# Patient Record
Sex: Male | Born: 1935 | Race: White | Hispanic: No | Smoking: Former smoker
Health system: Southern US, Community
[De-identification: ages and names within clinical notes are randomized; demographics above are authoritative.]

## PROBLEM LIST (undated history)

## (undated) DIAGNOSIS — H25019 Cortical age-related cataract, unspecified eye: Secondary | ICD-10-CM

## (undated) DIAGNOSIS — C679 Malignant neoplasm of bladder, unspecified: Secondary | ICD-10-CM

## (undated) DIAGNOSIS — K922 Gastrointestinal hemorrhage, unspecified: Secondary | ICD-10-CM

## (undated) DIAGNOSIS — Z889 Allergy status to unspecified drugs, medicaments and biological substances status: Secondary | ICD-10-CM

## (undated) DIAGNOSIS — I499 Cardiac arrhythmia, unspecified: Secondary | ICD-10-CM

## (undated) DIAGNOSIS — M1990 Unspecified osteoarthritis, unspecified site: Secondary | ICD-10-CM

## (undated) DIAGNOSIS — K7030 Alcoholic cirrhosis of liver without ascites: Secondary | ICD-10-CM

## (undated) DIAGNOSIS — Z86718 Personal history of other venous thrombosis and embolism: Secondary | ICD-10-CM

## (undated) DIAGNOSIS — I10 Essential (primary) hypertension: Secondary | ICD-10-CM

## (undated) DIAGNOSIS — I493 Ventricular premature depolarization: Secondary | ICD-10-CM

## (undated) DIAGNOSIS — F1020 Alcohol dependence, uncomplicated: Secondary | ICD-10-CM

## (undated) DIAGNOSIS — I82409 Acute embolism and thrombosis of unspecified deep veins of unspecified lower extremity: Secondary | ICD-10-CM

## (undated) DIAGNOSIS — E7800 Pure hypercholesterolemia, unspecified: Secondary | ICD-10-CM

## (undated) DIAGNOSIS — I4891 Unspecified atrial fibrillation: Secondary | ICD-10-CM

## (undated) DIAGNOSIS — Z86711 Personal history of pulmonary embolism: Secondary | ICD-10-CM

## (undated) DIAGNOSIS — G709 Myoneural disorder, unspecified: Secondary | ICD-10-CM

## (undated) HISTORY — PX: ABDOMINAL SURGERY: SHX537

## (undated) HISTORY — PX: TOTAL KNEE ARTHROPLASTY: SHX125

## (undated) HISTORY — PX: BLADDER REMOVAL: SHX567

## (undated) HISTORY — PX: TONSILLECTOMY: SUR1361

## (undated) HISTORY — PX: MANDIBLE FRACTURE SURGERY: SHX706

## (undated) HISTORY — PX: APPENDECTOMY: SHX54

## (undated) HISTORY — PX: JOINT REPLACEMENT: SHX530

## (undated) HISTORY — PX: NASAL SEPTUM SURGERY: SHX37

## (undated) HISTORY — PX: HERNIA REPAIR: SHX51

## (undated) HISTORY — PX: CHOLECYSTECTOMY: SHX55

---

## 2002-06-28 ENCOUNTER — Encounter: Admission: RE | Admit: 2002-06-28 | Discharge: 2002-06-28 | Payer: Self-pay | Admitting: Neurology

## 2002-06-28 ENCOUNTER — Encounter: Payer: Self-pay | Admitting: Neurology

## 2017-02-24 DIAGNOSIS — F39 Unspecified mood [affective] disorder: Secondary | ICD-10-CM | POA: Diagnosis not present

## 2017-02-24 DIAGNOSIS — K745 Biliary cirrhosis, unspecified: Secondary | ICD-10-CM

## 2017-02-24 DIAGNOSIS — D090 Carcinoma in situ of bladder: Secondary | ICD-10-CM | POA: Diagnosis not present

## 2017-02-24 DIAGNOSIS — Z936 Other artificial openings of urinary tract status: Secondary | ICD-10-CM

## 2017-02-24 DIAGNOSIS — I480 Paroxysmal atrial fibrillation: Secondary | ICD-10-CM

## 2017-02-28 DIAGNOSIS — L03311 Cellulitis of abdominal wall: Secondary | ICD-10-CM | POA: Diagnosis not present

## 2017-03-23 ENCOUNTER — Other Ambulatory Visit: Payer: Self-pay

## 2017-03-23 ENCOUNTER — Encounter: Payer: Self-pay | Admitting: Emergency Medicine

## 2017-03-23 ENCOUNTER — Emergency Department: Payer: Medicare Other

## 2017-03-23 ENCOUNTER — Observation Stay
Admission: EM | Admit: 2017-03-23 | Discharge: 2017-03-24 | Disposition: A | Discharge status: Home / Self Care | Payer: Medicare Other | Attending: Internal Medicine | Admitting: Internal Medicine

## 2017-03-23 DIAGNOSIS — Z7901 Long term (current) use of anticoagulants: Secondary | ICD-10-CM | POA: Diagnosis not present

## 2017-03-23 DIAGNOSIS — E860 Dehydration: Secondary | ICD-10-CM | POA: Insufficient documentation

## 2017-03-23 DIAGNOSIS — E785 Hyperlipidemia, unspecified: Secondary | ICD-10-CM | POA: Diagnosis not present

## 2017-03-23 DIAGNOSIS — I482 Chronic atrial fibrillation: Secondary | ICD-10-CM | POA: Insufficient documentation

## 2017-03-23 DIAGNOSIS — Z79899 Other long term (current) drug therapy: Secondary | ICD-10-CM | POA: Diagnosis not present

## 2017-03-23 DIAGNOSIS — N179 Acute kidney failure, unspecified: Secondary | ICD-10-CM | POA: Diagnosis not present

## 2017-03-23 DIAGNOSIS — N189 Chronic kidney disease, unspecified: Principal | ICD-10-CM | POA: Diagnosis present

## 2017-03-23 DIAGNOSIS — R2681 Unsteadiness on feet: Secondary | ICD-10-CM | POA: Diagnosis not present

## 2017-03-23 DIAGNOSIS — I509 Heart failure, unspecified: Secondary | ICD-10-CM | POA: Insufficient documentation

## 2017-03-23 DIAGNOSIS — Z8551 Personal history of malignant neoplasm of bladder: Secondary | ICD-10-CM | POA: Diagnosis not present

## 2017-03-23 HISTORY — DX: Malignant neoplasm of bladder, unspecified: C67.9

## 2017-03-23 HISTORY — DX: Unspecified atrial fibrillation: I48.91

## 2017-03-23 LAB — BASIC METABOLIC PANEL
ANION GAP: 13 (ref 5–15)
BUN: 56 mg/dL — ABNORMAL HIGH (ref 6–20)
CHLORIDE: 103 mmol/L (ref 101–111)
CO2: 17 mmol/L — AB (ref 22–32)
CREATININE: 2.41 mg/dL — AB (ref 0.61–1.24)
Calcium: 10.6 mg/dL — ABNORMAL HIGH (ref 8.9–10.3)
GFR calc Af Amer: 27 mL/min — ABNORMAL LOW (ref >60)
GFR calc non Af Amer: 23 mL/min — ABNORMAL LOW (ref >60)
GLUCOSE: 123 mg/dL — AB (ref 65–99)
Potassium: 5.4 mmol/L — ABNORMAL HIGH (ref 3.5–5.1)
Sodium: 133 mmol/L — ABNORMAL LOW (ref 135–145)

## 2017-03-23 LAB — CBC
HCT: 40 % (ref 40.0–52.0)
HEMOGLOBIN: 13.5 g/dL (ref 13.0–18.0)
MCH: 30.7 pg (ref 26.0–34.0)
MCHC: 33.7 g/dL (ref 32.0–36.0)
MCV: 91 fL (ref 80.0–100.0)
Platelets: 202 10*3/uL (ref 150–440)
RBC: 4.4 MIL/uL (ref 4.40–5.90)
RDW: 14.6 % — ABNORMAL HIGH (ref 11.5–14.5)
WBC: 7.9 10*3/uL (ref 3.8–10.6)

## 2017-03-23 LAB — URINALYSIS, COMPLETE (UACMP) WITH MICROSCOPIC
BILIRUBIN URINE: NEGATIVE
GLUCOSE, UA: NEGATIVE mg/dL
KETONES UR: NEGATIVE mg/dL
NITRITE: NEGATIVE
PH: 6 (ref 5.0–8.0)
Protein, ur: 30 mg/dL — AB
SPECIFIC GRAVITY, URINE: 1.016 (ref 1.005–1.030)

## 2017-03-23 MED ORDER — APIXABAN 2.5 MG PO TABS
2.5000 mg | ORAL_TABLET | Freq: Two times a day (BID) | ORAL | Status: DC
  Administered 2017-03-23 – 2017-03-24 (×2): 2.5 mg via ORAL
  Filled 2017-03-23 (×2): qty 1

## 2017-03-23 MED ORDER — OXYCODONE HCL 5 MG PO TABS: 5.0000 mg | ORAL_TABLET | ORAL | Status: DC | PRN

## 2017-03-23 MED ORDER — POLYETHYLENE GLYCOL 3350 17 G PO PACK
17.0000 g | PACK | Freq: Every day | ORAL | Status: DC
  Administered 2017-03-24: 17 g via ORAL
  Filled 2017-03-23: qty 1

## 2017-03-23 MED ORDER — ONDANSETRON HCL 4 MG PO TABS: 4.0000 mg | ORAL_TABLET | Freq: Four times a day (QID) | ORAL | Status: DC | PRN

## 2017-03-23 MED ORDER — LORATADINE 10 MG PO TABS
10.0000 mg | ORAL_TABLET | Freq: Every day | ORAL | Status: DC
  Administered 2017-03-24: 10 mg via ORAL
  Filled 2017-03-23: qty 1

## 2017-03-23 MED ORDER — SODIUM CHLORIDE 0.9 % IV SOLN
INTRAVENOUS | Status: DC
  Administered 2017-03-23 – 2017-03-24 (×2): via INTRAVENOUS

## 2017-03-23 MED ORDER — ATORVASTATIN CALCIUM 20 MG PO TABS
40.0000 mg | ORAL_TABLET | Freq: Every day | ORAL | Status: DC
  Administered 2017-03-24: 40 mg via ORAL
  Filled 2017-03-23: qty 2

## 2017-03-23 MED ORDER — ACETAMINOPHEN 325 MG PO TABS: 650.0000 mg | ORAL_TABLET | Freq: Four times a day (QID) | ORAL | Status: DC | PRN

## 2017-03-23 MED ORDER — SIMETHICONE 80 MG PO CHEW
80.0000 mg | CHEWABLE_TABLET | Freq: Four times a day (QID) | ORAL | Status: DC | PRN
  Filled 2017-03-23: qty 1

## 2017-03-23 MED ORDER — DOFETILIDE 250 MCG PO CAPS
250.0000 ug | ORAL_CAPSULE | Freq: Two times a day (BID) | ORAL | Status: DC
  Administered 2017-03-23 – 2017-03-24 (×2): 250 ug via ORAL
  Filled 2017-03-23 (×3): qty 1

## 2017-03-23 MED ORDER — ONDANSETRON HCL 4 MG/2ML IJ SOLN: 4.0000 mg | Freq: Four times a day (QID) | INTRAMUSCULAR | Status: DC | PRN

## 2017-03-23 MED ORDER — MAGNESIUM OXIDE 400 (241.3 MG) MG PO TABS
400.0000 mg | ORAL_TABLET | Freq: Two times a day (BID) | ORAL | Status: DC
  Administered 2017-03-23 – 2017-03-24 (×2): 400 mg via ORAL
  Filled 2017-03-23 (×2): qty 1

## 2017-03-23 MED ORDER — SERTRALINE HCL 50 MG PO TABS
50.0000 mg | ORAL_TABLET | Freq: Every day | ORAL | Status: DC
  Administered 2017-03-24: 50 mg via ORAL
  Filled 2017-03-23: qty 1

## 2017-03-23 MED ORDER — ADULT MULTIVITAMIN W/MINERALS CH
1.0000 | ORAL_TABLET | Freq: Every day | ORAL | Status: DC
  Administered 2017-03-24: 1 via ORAL
  Filled 2017-03-23: qty 1

## 2017-03-23 MED ORDER — MAGNESIUM HYDROXIDE 400 MG/5ML PO SUSP
30.0000 mL | Freq: Every day | ORAL | Status: DC | PRN
  Filled 2017-03-23 (×2): qty 30

## 2017-03-23 MED ORDER — ACETAMINOPHEN 650 MG RE SUPP: 650.0000 mg | Freq: Four times a day (QID) | RECTAL | Status: DC | PRN

## 2017-03-23 MED ORDER — LYSINE 1000 MG PO TABS: 1.0000 | ORAL_TABLET | Freq: Every day | ORAL | Status: DC

## 2017-03-23 MED ORDER — SODIUM CHLORIDE 0.9 % IV BOLUS (SEPSIS)
500.0000 mL | Freq: Once | INTRAVENOUS | Status: AC
  Administered 2017-03-23: 500 mL via INTRAVENOUS

## 2017-03-23 NOTE — ED Notes (Signed)
Report off to jenna rn  

## 2017-03-23 NOTE — Progress Notes (Signed)
PHARMACIST - PHYSICIAN ORDER COMMUNICATION  CONCERNING: P&T Medication Policy on Herbal Medications  DESCRIPTION:  This patient's order for: Lysine  has been noted.  This product(s) is classified as an "herbal" or natural product. Due to a lack of definitive safety studies or FDA approval, nonstandard manufacturing practices, plus the potential risk of unknown drug-drug interactions while on inpatient medications, the Pharmacy and Therapeutics Committee does not permit the use of "herbal" or natural products of this type within [place].   ACTION TAKEN: The pharmacy department is unable to verify this order at this time and your patient has been informed of this safety policy. Please reevaluate patient's clinical condition at discharge and address if the herbal or natural product(s) should be resumed at that time.  

## 2017-03-23 NOTE — ED Notes (Signed)
Pt c/o feeling fatiqued after getting the flu shot this week.  Pt has bladder cancer and has urostomy.  Pt's last chemo treatment was 8/18  [facility]reated at [facility].  Pt denies n/v/d.  Pt lives at home with daughter.  md with pt.

## 2017-03-23 NOTE — ED Provider Notes (Addendum)
[facility] Emergency Department Provider Note  ____________________________________________   I have reviewed the triage vital signs and the nursing notes.   HISTORY  Chief Complaint Weakness    HPI Joseph Goodwin is a 83 y.o. male who has a history of bladder cancer who is no longer taking chemo does have a urostomy, has also got a history of bladder removal, has had blood clots in the past and is on Eliquis, history of atrial fibrillation status post cardioversion in 2014, has had gradually increasing creatinine over the last several months, most recent creatinine was 1.8.  Patient states he has been feeling generally weak.  He has no other symptoms.  No fever no chills no nausea no vomiting or diarrhea, he is making urine through his urostomy.  He denies any diarrhea melena or bright red blood per rectum, no hematemesis no focal numbness or weakness.  Review of systems is negative aside from the fact that he feels generally and progressively more weak over the last several days.  He did get his flu shot 2 days ago.      Past Medical History:  Diagnosis Date  . Atrial fibrillation ([place])   . Bladder cancer ([place])     There are no active problems to display for this patient.   Past Surgical History:  Procedure Laterality Date  . ABDOMINAL SURGERY    . APPENDECTOMY    . BLADDER REMOVAL    . CHOLECYSTECTOMY    . HERNIA REPAIR    . MANDIBLE FRACTURE SURGERY    . TOTAL KNEE ARTHROPLASTY Bilateral     Prior to Admission medications   Medication Sig Start Date End Date Taking? Authorizing Provider  apixaban (ELIQUIS) 2.5 MG TABS tablet Take 2.5 mg 2 (two) times daily by mouth.   Yes [provider]  atorvastatin (LIPITOR) 40 MG tablet Take 40 mg daily by mouth.   Yes [provider]  dofetilide (TIKOSYN) 250 MCG capsule Take 250 mcg 2 (two) times daily by mouth.   Yes [provider]  fexofenadine (ALLEGRA) 180 MG tablet Take 180  mg daily by mouth.   Yes [provider]  furosemide (LASIX) 20 MG tablet Take 20 mg by mouth.   Yes [provider]  Lysine 1000 MG TABS Take 1 tablet daily by mouth.   Yes [provider]  magnesium hydroxide (MILK OF MAGNESIA) 400 MG/5ML suspension Take 30 mLs daily as needed by mouth for mild constipation.   Yes [provider]  magnesium oxide (MAG-OX) 400 MG tablet Take 400 mg 2 (two) times daily by mouth.   Yes [provider]  Multiple Vitamin (MULTIVITAMIN) tablet Take 1 tablet daily by mouth.   Yes [provider]  ondansetron (ZOFRAN-ODT) 4 MG disintegrating tablet Take 4 mg every 8 (eight) hours as needed by mouth for nausea or vomiting.   Yes [provider]  oxycodone (OXY-IR) 5 MG capsule Take 5 mg every 4 (four) hours as needed by mouth.   Yes [provider]  polyethylene glycol (MIRALAX / GLYCOLAX) packet Take 17 g daily by mouth.   Yes [provider]  Sennosides-Docusate Sodium (SENNA S PO) Take 2 tablets daily by mouth.   Yes [provider]  sertraline (ZOLOFT) 50 MG tablet Take 50 mg daily by mouth.   Yes [provider]  simethicone (MYLICON) 80 MG chewable tablet Chew 80 mg every 6 (six) hours as needed by mouth for flatulence.   Yes [provider]  spironolactone (ALDACTONE) 100 MG tablet Take 100 mg daily by mouth.   Yes [provider]    Allergies Patient has no known allergies.  No family history on file.  Social History Social History   Tobacco Use  . Smoking status: Never Smoker  . Smokeless tobacco: Never Used  Substance Use Topics  . Alcohol use: No    Frequency: Never  . Drug use: Not on file    Review of Systems Constitutional: No fever/chills Eyes: No visual changes. ENT: No sore throat. No stiff neck no neck pain Cardiovascular: Denies chest pain. Respiratory: Denies shortness of breath. Gastrointestinal:   no vomiting.  No  diarrhea.  No constipation. Genitourinary: See HPI Musculoskeletal: Negative lower extremity swelling Skin: Negative for rash. Neurological: Negative for severe headaches, focal weakness or numbness.   ____________________________________________   PHYSICAL EXAM:  VITAL SIGNS: ED Triage Vitals  Enc Vitals Group     BP 03/23/17 1449 139/73     Pulse Rate 03/23/17 1449 76     Resp 03/23/17 1449 16     Temp 03/23/17 1449 97.7 F (36.5 C)     Temp Source 03/23/17 1449 Oral     SpO2 03/23/17 1449 96 %     Weight 03/23/17 1449 175 lb (79.4 kg)     Height 03/23/17 1449 5\' 9"  (1.753 m)     Head Circumference --      Peak Flow --      Pain Score 03/23/17 1448 4     Pain Loc --      Pain Edu? --      Excl. in [place]? --     Constitutional: Alert and oriented. Well appearing and in no acute distress. Eyes: Conjunctivae are normal Head: Atraumatic HEENT: No congestion/rhinnorhea. Mucous membranes are moist.  Oropharynx non-erythematous Neck:   Nontender with no meningismus, no masses, no stridor Cardiovascular: Normal rate, regular rhythm. Grossly normal heart sounds.  Good peripheral circulation. Respiratory: Normal respiratory effort.  No retractions. Lungs CTAB. Abdominal: Soft and nontender. No distention. No guarding no rebound urostomy is draining with no evidence of infection pink and healthy-appearing tissue noted Back:  There is no focal tenderness or step off.  there is no midline tenderness there are no lesions noted. there is no CVA tenderness  Musculoskeletal: No lower extremity tenderness, no upper extremity tenderness. No joint effusions, no DVT signs strong distal pulses no edema Neurologic:  Normal speech and language. No gross focal neurologic deficits are appreciated.  Skin:  Skin is warm, dry and intact. No rash noted. Psychiatric: Mood and affect are normal. Speech and behavior are normal.  ____________________________________________   LABS (all labs ordered  are listed, but only abnormal results are displayed)  Labs Reviewed  BASIC METABOLIC PANEL - Abnormal; Notable for the following components:      Result Value   Sodium 133 (*)    Potassium 5.4 (*)    CO2 17 (*)    Glucose, Bld 123 (*)    BUN 56 (*)    Creatinine, Ser 2.41 (*)    Calcium 10.6 (*)    GFR calc non Af Amer 23 (*)    GFR calc Af Amer 27 (*)    All other components within normal limits  CBC - Abnormal; Notable for the following components:   RDW 14.6 (*)    All other components within normal limits  URINE CULTURE  URINALYSIS, COMPLETE (UACMP) WITH MICROSCOPIC    Pertinent labs  results that were available during my care of the patient were reviewed by me and considered in my medical decision making (see chart for details). ____________________________________________  EKG  I personally interpreted any EKGs ordered by me or triage Rate 71 bpm, normal axis no acute ST elevation or depression, most likely sinus rhythm with PACs.  PR is borderline long ____________________________________________  RADIOLOGY  Pertinent labs & imaging results that were available during my care of the patient were reviewed by me and considered in my medical decision making (see chart for details). If possible, patient and/or family made aware of any abnormal findings. ____________________________________________    PROCEDURES  Procedure(s) performed: None  Procedures  Critical Care performed: None  ____________________________________________   INITIAL IMPRESSION / ASSESSMENT AND PLAN / ED COURSE  Pertinent labs & imaging results that were available during my care of the patient were reviewed by me and considered in my medical decision making (see chart for details). Patient here feeling generally weak, he is nontoxic in appearance, however his creatinine has gone up considerably in the last few weeks and his BUN is rising as well I suspect this could attribute to a generalized  sense of malaise.  We will give him gentle IV hydration as he is on Lasix, and we will obtain ultrasound of his kidneys to evaluate for obstructive pathology given prior surgery in early October, I have offered the patient admission for hydration and correction of his acute renal injury and he accepts.  He would prefer not to be transferred to other facilities I did discuss that as well as most of his care has been received at [facility].  He very much would prefer to stay here if possible.  No other acute cause of the patient to feel generally unwell are noted.  Heart rate was documented twice in the 30s by the nurse and this will have to be monitored however I do not see any evidence of that at any time that I have seen the monitor is been in the 60s with no difficulty, and he is in the 70s on his EKG I do not actually see any bradycardia I suspect these may be entered in error but he will need to be observed for that reason as well  ----------------------------------------- 6:08 PM on 03/23/2017 -----------------------------------------  Patient has a known right renal cyst on [facility] at [facility], ultrasound does not show any evidence of hydronephrosis this is most likely prerenal, dehydrational in other words.  We are giving him IV fluid will admit for further evaluation.    ____________________________________________   FINAL CLINICAL IMPRESSION(S) / ED DIAGNOSES  Final diagnoses:  None      This chart was dictated using voice recognition software.  Despite best efforts to proofread,  errors can occur which can change meaning.      Schuyler Amor, MD 03/23/17 1712    Schuyler Amor, MD 03/23/17 [id number]

## 2017-03-23 NOTE — ED Triage Notes (Signed)
Had surgery recently for bladder cancer.  Was at rehab and they gave flu shot just before discharge on Tuesday.  Since then he has felt weak.

## 2017-03-23 NOTE — H&P (Signed)
[place] at [place] NAME: Joseph Goodwin    MR#:  [id number]  DATE OF BIRTH:  11/07/1931  DATE OF ADMISSION:  03/23/2017  PRIMARY CARE PHYSICIAN: Glennie Hawk, MD   REQUESTING/REFERRING PHYSICIAN: Dr. Charlotte Crumb  CHIEF COMPLAINT:   Chief Complaint  Patient presents with  . Weakness    HISTORY OF PRESENT ILLNESS:  Joseph Goodwin  is a 83 y.o. male with a known history of bladder cancer status post recent cystectomy and prostatectomy with ileal conduit done at [facility], chronic atrial fibrillation, history of congestive heart failure who presents to the hospital due to generalized weakness and noted to be in acute on chronic kidney failure. Patient was recently diagnosed with bladder cancer and underwent surgery done at [facility] last month. He was discharged to a skilled nursing facility at [facility] [place]. He was recently discharge from the skilled side of town in [place] to independent living to 3 days ago and has not been feeling well with generalized weakness poor appetite. He denies any nausea vomiting or diarrhea or any abdominal pain. He denies any fevers chills cough congestion or any other upper respiratory symptoms. He is on diuretics which she has been taking despite having a poor appetite and feeling weak. Patient says he received a flu shot to 3 days ago prior to being discharged from the skilled side of [place] to the independent side and thinks his symptoms are related to that. Patient's baseline creatinine is around 1.6-1.8 and he presented to the ER with a creatinine of 2.4 and therefore hospitalist services were contacted further treatment and evaluation.  PAST MEDICAL HISTORY:   Past Medical History:  Diagnosis Date  . Atrial fibrillation ([place])   . Bladder cancer ([place])     PAST SURGICAL HISTORY:   Past Surgical History:  Procedure Laterality Date  . ABDOMINAL SURGERY    . APPENDECTOMY    . BLADDER REMOVAL    . CHOLECYSTECTOMY    .  HERNIA REPAIR    . MANDIBLE FRACTURE SURGERY    . TOTAL KNEE ARTHROPLASTY Bilateral     SOCIAL HISTORY:   Social History   Tobacco Use  . Smoking status: Never Smoker  . Smokeless tobacco: Never Used  Substance Use Topics  . Alcohol use: No    Frequency: Never    FAMILY HISTORY:   Family History  Problem Relation Age of Onset  . Hypertension Mother   . Heart attack Father     DRUG ALLERGIES:  No Known Allergies  REVIEW OF SYSTEMS:   Review of Systems  Constitutional: Negative for fever and weight loss.  HENT: Negative for congestion, nosebleeds and tinnitus.   Eyes: Negative for blurred vision, double vision and redness.  Respiratory: Negative for cough, hemoptysis and shortness of breath.   Cardiovascular: Negative for chest pain, orthopnea, leg swelling and PND.  Gastrointestinal: Negative for abdominal pain, diarrhea, melena, nausea and vomiting.  Genitourinary: Negative for dysuria, hematuria and urgency.  Musculoskeletal: Negative for falls and joint pain.  Neurological: Positive for weakness. Negative for dizziness, tingling, sensory change, focal weakness, seizures and headaches.  Endo/Heme/Allergies: Negative for polydipsia. Does not bruise/bleed easily.  Psychiatric/Behavioral: Negative for depression and memory loss. The patient is not nervous/anxious.     MEDICATIONS AT HOME:   Prior to Admission medications   Medication Sig Start Date End Date Taking? Authorizing Provider  apixaban (ELIQUIS) 2.5 MG TABS tablet Take 2.5 mg 2 (two) times daily by mouth.  Yes [provider]  atorvastatin (LIPITOR) 40 MG tablet Take 40 mg daily by mouth.   Yes [provider]  dofetilide (TIKOSYN) 250 MCG capsule Take 250 mcg 2 (two) times daily by mouth.   Yes [provider]  fexofenadine (ALLEGRA) 180 MG tablet Take 180 mg daily by mouth.   Yes [provider]  furosemide (LASIX) 20 MG tablet Take 20 mg by mouth.   Yes [provider]  Lysine 1000 MG TABS Take 1 tablet daily by mouth.   Yes [provider]  magnesium hydroxide (MILK OF MAGNESIA) 400 MG/5ML suspension Take 30 mLs daily as needed by mouth for mild constipation.   Yes [provider]  magnesium oxide (MAG-OX) 400 MG tablet Take 400 mg 2 (two) times daily by mouth.   Yes [provider]  Multiple Vitamin (MULTIVITAMIN) tablet Take 1 tablet daily by mouth.   Yes [provider]  ondansetron (ZOFRAN-ODT) 4 MG disintegrating tablet Take 4 mg every 8 (eight) hours as needed by mouth for nausea or vomiting.   Yes [provider]  oxycodone (OXY-IR) 5 MG capsule Take 5 mg every 4 (four) hours as needed by mouth.   Yes [provider]  polyethylene glycol (MIRALAX / GLYCOLAX) packet Take 17 g daily by mouth.   Yes [provider]  Sennosides-Docusate Sodium (SENNA S PO) Take 2 tablets daily by mouth.   Yes [provider]  sertraline (ZOLOFT) 50 MG tablet Take 50 mg daily by mouth.   Yes [provider]  simethicone (MYLICON) 80 MG chewable tablet Chew 80 mg every 6 (six) hours as needed by mouth for flatulence.   Yes [provider]  spironolactone (ALDACTONE) 100 MG tablet Take 100 mg daily by mouth.   Yes [provider]      VITAL SIGNS:  Blood pressure (!) 155/68, pulse 64, temperature 97.7 F (36.5 C), temperature source Oral, resp. rate 20, height 5\' 9"  (1.753 m), weight 79.4 kg (175 lb), SpO2 95 %.  PHYSICAL EXAMINATION:  Physical Exam  GENERAL:  83 y.o.-year-old patient lying in the bed in no acute distress.  EYES: Pupils equal, round, reactive to light and accommodation. No scleral icterus. Extraocular muscles intact.  HEENT: Head atraumatic, normocephalic. Oropharynx and nasopharynx clear. No oropharyngeal erythema, moist oral mucosa  NECK:  Supple, no jugular venous distention. No thyroid enlargement, no tenderness.  LUNGS: Normal breath  sounds bilaterally, no wheezing, rales, rhonchi. No use of accessory muscles of respiration.  CARDIOVASCULAR: S1, S2 RRR. No murmurs, rubs, gallops, clicks.  ABDOMEN: Soft, nontender, nondistended. Bowel sounds present. No organomegaly or mass. + urostomy in place with yellow urine with some sediment noted.  EXTREMITIES: No pedal edema, cyanosis, or clubbing. + 2 pedal & radial pulses b/l.   NEUROLOGIC: Cranial nerves II through XII are intact. No focal Motor or sensory deficits appreciated b/l. Globally weak.  PSYCHIATRIC: The patient is alert and oriented x 3.   SKIN: No obvious rash, lesion, or ulcer.   LABORATORY PANEL:   CBC Recent Labs  Lab 03/23/17 1455  WBC 7.9  HGB 13.5  HCT 40.0  PLT 202   ------------------------------------------------------------------------------------------------------------------  Chemistries  Recent Labs  Lab 03/23/17 1455  NA 133*  K 5.4*  CL 103  CO2 17*  GLUCOSE 123*  BUN 56*  CREATININE 2.41*  CALCIUM 10.6*   ------------------------------------------------------------------------------------------------------------------  Cardiac Enzymes No results for input(s): TROPONINI in the last 168 hours. ------------------------------------------------------------------------------------------------------------------  RADIOLOGY:  US  Renal  Result Date: 03/23/2017 CLINICAL DATA:  Patient with elevated creatinine. EXAM: RENAL / URINARY TRACT ULTRASOUND COMPLETE COMPARISON:  None. FINDINGS: Right Kidney: Length: 8.7 cm. Increased renal cortical echogenicity. Mild renal cortical thinning. No hydronephrosis. There is a 2.0 x 1.7 x 2.3 cm complex cystic lesion. Left Kidney: Length: 10.2 cm. Increased renal cortical echogenicity. Mild renal cortical thinning. No hydronephrosis. Bladder: Surgically absent. IMPRESSION: Increased renal cortical echogenicity as can be seen with chronic medical renal disease. No hydronephrosis. Indeterminate complex cystic  lesion interpolar region right kidney. Consider further evaluation with CT or MRI as clinically indicated in the nonacute setting. Electronically Signed   By: Lovey Newcomer M.D.   On: 03/23/2017 17:53     IMPRESSION AND PLAN:   83 year old male with past medical history of bladder cancer status post recent cystectomy/prostatectomy and also ileal conduit placement, chronic atrial fibrillation, history of CHF, hyperlipidemia who presents to the hospital due to generalized weakness and noted to be in acute on chronic renal failure.  1. Acute on chronic renal failure-secondary to dehydration, poor by mouth intake and also concomitant use of diuretics. -Patient's baseline creatinine is around 1.6-1.8 and currently elevated 2.4. I will hold his Lasix, Aldactone. -Gently hydrate with IV fluids, follow BUN and creatinine urine output.  2. History of chronic atrial fibrillation-currently in normal sinus rhythm and rate controlled. -Continue dofetilide, continue Eliquis.  3. History of congestive heart failure-clinically patient is not in congestive heart failure. Hold Lasix, Aldactone given the patient's acute on chronic kidney injury.  4. Hyperlipidemia-continue atorvastatin.  5. History of bladder cancer status post recent cystectomy/prostatectomy and also ileal conduit placement-patient follows up at [facility]. -Continue oxycodone as needed for pain.  6. Depression-continue Zoloft.    All the records are reviewed and case discussed with ED provider. Management plans discussed with the patient, family and they are in agreement.  CODE STATUS: Full code  TOTAL TIME TAKING CARE OF THIS PATIENT: 45 minutes.    Henreitta Leber M.D on 03/23/2017 at 6:56 PM  Between 7am to 6pm - Pager - [phone]  After 6pm go to [email or web address] - password [email or web address] [facility]  [facility]  Office  [phone]  CC: Primary care physician; Glennie Hawk, MD

## 2017-03-24 DIAGNOSIS — N189 Chronic kidney disease, unspecified: Secondary | ICD-10-CM | POA: Diagnosis not present

## 2017-03-24 LAB — CBC
HCT: 34.8 % — ABNORMAL LOW (ref 40.0–52.0)
Hemoglobin: 11.6 g/dL — ABNORMAL LOW (ref 13.0–18.0)
MCH: 30.2 pg (ref 26.0–34.0)
MCHC: 33.3 g/dL (ref 32.0–36.0)
MCV: 90.6 fL (ref 80.0–100.0)
Platelets: 161 10*3/uL (ref 150–440)
RBC: 3.84 MIL/uL — ABNORMAL LOW (ref 4.40–5.90)
RDW: 14.6 % — AB (ref 11.5–14.5)
WBC: 6.2 10*3/uL (ref 3.8–10.6)

## 2017-03-24 LAB — BASIC METABOLIC PANEL
ANION GAP: 7 (ref 5–15)
BUN: 51 mg/dL — AB (ref 6–20)
CALCIUM: 9.7 mg/dL (ref 8.9–10.3)
CO2: 21 mmol/L — ABNORMAL LOW (ref 22–32)
CREATININE: 2.06 mg/dL — AB (ref 0.61–1.24)
Chloride: 108 mmol/L (ref 101–111)
GFR calc Af Amer: 33 mL/min — ABNORMAL LOW (ref >60)
GFR, EST NON AFRICAN AMERICAN: 28 mL/min — AB (ref >60)
GLUCOSE: 96 mg/dL (ref 65–99)
POTASSIUM: 4.6 mmol/L (ref 3.5–5.1)
SODIUM: 136 mmol/L (ref 135–145)

## 2017-03-24 LAB — MAGNESIUM: MAGNESIUM: 1.9 mg/dL (ref 1.7–2.4)

## 2017-03-24 NOTE — Clinical Social Work Note (Signed)
Clinical Social Work Assessment  Patient Details  Name: Joseph Goodwin MRN: [id number] Date of Birth: September 08, 1931  Date of referral:  03/24/17               Reason for consult:  Discharge Planning                Permission sought to share information with:  Family Supports, Chartered certified accountant granted to share information::  Yes, Verbal Permission Granted  Name::        Agency::     Relationship::     Contact Information:     Housing/Transportation Living arrangements for the past 2 months:  Materials engineer, [place] Point of Information:  Patient, Adult Children Patient Interpreter Needed:  None Criminal Activity/Legal Involvement Pertinent to Current Situation/Hospitalization:  No - Comment as needed Significant Relationships:  None Lives with:    Do you feel safe going back to the place where you live?  Yes Need for family participation in patient care:  Yes (Comment)  Care giving concerns:  Patient resides at [facility].   Social Worker assessment / plan:  Patient to discharge today and was just discharged from [facility] skilled back to independent. Due to patient's wound dehiscing and some labs being abnormal, patient and daughter prefer he return to [facility] skilled today.   Employment status:  Retired Forensic scientist:  Medicare PT Recommendations:  No Follow Up Information / Referral to community resources:     Patient/Family's Response to care:  Patient expressed appreciation for CSW assistance.  Patient/Family's Understanding of and Emotional Response to Diagnosis, Current Treatment, and Prognosis:  Patient and daughter wish to proceed with skilled even though from a PT standpoint, patient does not require skilled.  Emotional Assessment Appearance:  Appears stated age Attitude/Demeanor/Rapport:  (pleasant and cooperative) Affect (typically observed):  Calm Orientation:  Oriented to Self,  Oriented to Place, Oriented to  Time, Oriented to Situation Alcohol / Substance use:  Not Applicable Psych involvement (Current and /or in the community):  No (Comment)  Discharge Needs  Concerns to be addressed:  Care Coordination Readmission within the last 30 days:  Yes Current discharge risk:  None Barriers to Discharge:  No Barriers Identified   Shela Leff, LCSW 03/24/2017, 2:23 PM

## 2017-03-24 NOTE — Consult Note (Signed)
[facility] Nurse ostomy consult note Stoma type/location: RLQ ileal conduit.  Has dehiscence of laparascopic site below stoma.  Filling with stoma powder and protecting with barrier.  Stomal assessment/size: 1 1/8"  Pink patent and producing clear yellow urine Peristomal assessment: intact  Stoma is in a crease, convexity and barrier ring is used. Patient is using his own supplies from home.  Treatment options for stomal/peristomal skin: convex barrier. Barrier ring and stoma powder for wound care Output yellow urine Ostomy pouching: 2pc. Convex barrier with barrier ring and stoma powder.  Attached to bedside drainage.  Pink tape is used (wndow framed) for extra support and patient preference. Has worn an ostomy belt in the past, but founf this pulled his pouch loose and does not wish to incorporate one at this time.  Education provided: pouch change performed with daughter and patient  She is leaving to go home soon and patient would like a little more teaching.  Can hook and disconnect from bedside drainage.  Can empty independenttly .  States he can perform pouch change in front of bathroom mirror at home.  Understnds rationale for barrie rring, stoma powder and convexity.  Usual wear time is fourdays.  Enrolled patient in [place] program: Yes previously Will not follow at this time.  Please re-consult if needed. Discharging today Domenic Moras RN BSN [facility] Pager [phone]

## 2017-03-24 NOTE — Care Management Obs Status (Signed)
[place] NOTIFICATION   Patient Details  Name: Joseph Goodwin MRN: [id number] Date of Birth: [birth year]   Medicare Observation Status Notification Given:  No(admitted obs less than 24 hours)    Beverly Sessions, RN 03/24/2017, 11:33 AM

## 2017-03-24 NOTE — Progress Notes (Signed)
While daughter was changing out patient's urostomy wafer, it was noted that one of the lap sites from his surgery a month ago, distal to ostomy, had dehisced. It was open about 2cm width and about 0.5cm deep; pink around the edges, but not bleeding; steri strips applied (this RN) to close and urostomy wafer reapplied by daughter.  All other previous lap sites closed and without complications. Barbaraann Faster, RN 12:37 AM 03/24/2017

## 2017-03-24 NOTE — Clinical Social Work Placement (Signed)
   CLINICAL SOCIAL WORK PLACEMENT  NOTE  Date:  03/24/2017  Patient Details  Name: Joseph Goodwin MRN: [id number] Date of Birth: Apr 14, 1932  Clinical Social Work is seeking post-discharge placement for this patient at the [place] level of care (*CSW will initial, date and re-position this form in  chart as items are completed):  Yes   Patient/family provided with [facility] Work Department's list of facilities offering this level of care within the geographic area requested by the patient (or if unable, by the patient's family).  Yes   Patient/family informed of their freedom to choose among providers that offer the needed level of care, that participate in Medicare, Medicaid or managed care program needed by the patient, have an available bed and are willing to accept the patient.  Yes   Patient/family informed of [facility]'s ownership interest in [facility] and [facility], as well as of the fact that they are under no obligation to receive care at these facilities.  PASRR submitted to EDS on       PASRR number received on       Existing PASRR number confirmed on 03/24/17     FL2 transmitted to all facilities in geographic area requested by pt/family on 03/24/17     FL2 transmitted to all facilities within larger geographic area on       Patient informed that his/her managed care company has contracts with or will negotiate with certain facilities, including the following:        Yes   Patient/family informed of bed offers received.  Patient chooses bed at [facility])     Physician recommends and patient chooses bed at [facility])    Patient to be transferred to [facility]) on 03/24/17.  Patient to be transferred to facility by (daughter)     Patient family notified on 03/24/17 of transfer.  Name of family member notified:  (daughter)     PHYSICIAN       Additional Comment:     _______________________________________________ Shela Leff, LCSW 03/24/2017, 2:28 PM

## 2017-03-24 NOTE — Evaluation (Signed)
Physical Therapy Evaluation Patient Details Name: Joseph Goodwin MRN: [id number] DOB: 03-05-1932 Today's Date: 03/24/2017   History of Present Illness  Pt admitted for acute on chronic renal failure. Pt with complaints of weakness. History includes bladder cancer with recent cystectomy and prostatectomy along with CHF. He then transferred to [facility] for STR followed by transitioning to ILF.   Clinical Impression  Pt is a pleasant 83 year old male who was admitted for acute on chronic renal failure. Pt demonstrates all bed mobility/transfers/ambulation at baseline level without AD. Pt is independent with all mobility. Pt does not require any further PT needs at this time. Pt will be dc in house and does not require follow up. RN aware. Will dc current orders.      Follow Up Recommendations No PT follow up    Equipment Recommendations  None recommended by PT    Recommendations for Other Services       Precautions / Restrictions Precautions Precautions: Fall Restrictions Weight Bearing Restrictions: No      Mobility  Bed Mobility Overal bed mobility: Independent             General bed mobility comments: safe technique performed with pt able to get dressed while sitting at EOB  Transfers Overall transfer level: Independent Equipment used: None             General transfer comment: upright posture noted without AD. Safe technique  Ambulation/Gait Ambulation/Gait assistance: Supervision Ambulation Distance (Feet): 200 Feet Assistive device: None Gait Pattern/deviations: WFL(Within Functional Limits)     General Gait Details: ambulated with quick gait speed and reciprocal gait pattern. Safe technique, however needed a cue to slow down  Financial trader Rankin (Stroke Patients Only)       Balance Overall balance assessment: Independent                                           Pertinent  Vitals/Pain Pain Assessment: No/denies pain    Home Living Family/patient expects to be discharged to:: Private residence Living Arrangements: (at [place])   Type of Home: Independent living facility Home Access: Level entry     Home Layout: One level Home Equipment: None      Prior Function Level of Independence: Independent         Comments: was previously independent and reports no history of falls     Hand Dominance        Extremity/Trunk Assessment   Upper Extremity Assessment Upper Extremity Assessment: Overall WFL for tasks assessed    Lower Extremity Assessment Lower Extremity Assessment: Overall WFL for tasks assessed       Communication   Communication: No difficulties  Cognition Arousal/Alertness: Awake/alert Behavior During Therapy: WFL for tasks assessed/performed Overall Cognitive Status: Within Functional Limits for tasks assessed                                        General Comments      Exercises     Assessment/Plan    PT Assessment Patent does not need any further PT services  PT Problem List         PT Treatment Interventions  PT Goals (Current goals can be found in the Care Plan section)  Acute Rehab PT Goals Patient Stated Goal: to go home PT Goal Formulation: All assessment and education complete, DC therapy Time For Goal Achievement: 03/24/17 Potential to Achieve Goals: Good    Frequency     Barriers to discharge        Co-evaluation               AM-PAC PT "6 Clicks" Daily Activity  Outcome Measure Difficulty turning over in bed (including adjusting bedclothes, sheets and blankets)?: None Difficulty moving from lying on back to sitting on the side of the bed? : None Difficulty sitting down on and standing up from a chair with arms (e.g., wheelchair, bedside commode, etc,.)?: None Help needed moving to and from a bed to chair (including a wheelchair)?: None Help needed walking in hospital  room?: None Help needed climbing 3-5 steps with a railing? : None 6 Click Score: 24    End of Session Equipment Utilized During Treatment: Gait belt Activity Tolerance: Patient tolerated treatment well Patient left: (left standing in room, packing his stuff, RN aware) Nurse Communication: Mobility status PT Visit Diagnosis: Unsteadiness on feet (R26.81)    Time: 0300-9233 PT Time Calculation (min) (ACUTE ONLY): 11 min   Charges:   PT Evaluation $PT Eval Low Complexity: 1 Low     PT G Codes:   PT G-Codes **NOT FOR INPATIENT CLASS** Functional Assessment Tool Used: AM-PAC 6 Clicks Basic Mobility Functional Limitation: Mobility: Walking and moving around Mobility: Walking and Moving Around Current Status (A0762): 0 percent impaired, limited or restricted Mobility: Walking and Moving Around Goal Status (U6333): 0 percent impaired, limited or restricted Mobility: Walking and Moving Around Discharge Status (L4562): 0 percent impaired, limited or restricted    Greggory Stallion, PT, DPT [phone]   Raylynn Hersh 03/24/2017, 3:33 PM

## 2017-03-24 NOTE — [place] FL2 (Signed)
[place] LEVEL OF CARE SCREENING TOOL     IDENTIFICATION  Patient Name: Joseph Goodwin Birthdate: January 05, 1932 Sex: male Admission Date (Current Location): 03/23/2017  [place] and [place] Number:  Engineering geologist and Address:  [facility], [address] [place], [place] [address]      Provider Number: [id number]  Attending Physician Name and Address:  Demetrios Loll, MD  Relative Name and Phone Number:       Current Level of Care: Hospital Recommended Level of Care: [place] Prior Approval Number:    Date Approved/Denied:   PASRR Number:    Discharge Plan: SNF    Current Diagnoses: Patient Active Problem List   Diagnosis Date Noted  . Acute on chronic renal failure (HCC) 03/23/2017    Orientation RESPIRATION BLADDER Height & Weight     Self, Time, Situation, Place  Normal Continent Weight: 169 lb 11.2 oz (77 kg) Height:  5\' 9"  (175.3 cm)  BEHAVIORAL SYMPTOMS/MOOD NEUROLOGICAL BOWEL NUTRITION STATUS  (none) (none) Continent Diet(hearth healthy)  AMBULATORY STATUS COMMUNICATION OF NEEDS Skin   Limited Assist Verbally (dehisced)                       Personal Care Assistance Level of Assistance  Dressing, Bathing Bathing Assistance: Limited assistance   Dressing Assistance: Limited assistance     Functional Limitations Info  (none)          SPECIAL CARE FACTORS FREQUENCY  PT (By licensed PT)                    Contractures Contractures Info: Not present    Additional Factors Info  Code Status Code Status Info: nka             Current Medications (03/24/2017):  This is the current hospital active medication list Current Facility-Administered Medications  Medication Dose Route Frequency Provider Last Rate Last Dose  . 0.9 %  sodium chloride infusion   Intravenous Continuous Henreitta Leber, MD 75 mL/hr at 03/24/17 1048    . acetaminophen (TYLENOL) tablet 650 mg  650 mg Oral Q6H  PRN Henreitta Leber, MD       Or  . acetaminophen (TYLENOL) suppository 650 mg  650 mg Rectal Q6H PRN Henreitta Leber, MD      . apixaban (ELIQUIS) tablet 2.5 mg  2.5 mg Oral BID Henreitta Leber, MD   2.5 mg at 03/24/17 1049  . atorvastatin (LIPITOR) tablet 40 mg  40 mg Oral Daily Henreitta Leber, MD   40 mg at 03/24/17 1050  . dofetilide (TIKOSYN) capsule 250 mcg  250 mcg Oral BID Henreitta Leber, MD   250 mcg at 03/24/17 0843  . loratadine (CLARITIN) tablet 10 mg  10 mg Oral Daily Henreitta Leber, MD   10 mg at 03/24/17 1050  . magnesium hydroxide (MILK OF MAGNESIA) suspension 30 mL  30 mL Oral Daily PRN Henreitta Leber, MD      . magnesium oxide (MAG-OX) tablet 400 mg  400 mg Oral BID Henreitta Leber, MD   400 mg at 03/24/17 1050  . multivitamin with minerals tablet 1 tablet  1 tablet Oral Daily Henreitta Leber, MD   1 tablet at 03/24/17 1050  . ondansetron (ZOFRAN) tablet 4 mg  4 mg Oral Q6H PRN Henreitta Leber, MD       Or  . ondansetron [facility]) injection 4 mg  4 mg Intravenous Q6H PRN Henreitta Leber, MD      . oxyCODONE (Oxy IR/ROXICODONE) immediate release tablet 5 mg  5 mg Oral Q4H PRN Sainani, Belia Heman, MD      . polyethylene glycol (MIRALAX / GLYCOLAX) packet 17 g  17 g Oral Daily Henreitta Leber, MD   17 g at 03/24/17 1049  . sertraline (ZOLOFT) tablet 50 mg  50 mg Oral Daily Henreitta Leber, MD   50 mg at 03/24/17 1050  . simethicone (MYLICON) chewable tablet 80 mg  80 mg Oral Q6H PRN Henreitta Leber, MD         Discharge Medications: Please see discharge summary for a list of discharge medications.  Relevant Imaging Results:  Relevant Lab Results:   Additional Information    Shela Leff, LCSW

## 2017-03-24 NOTE — Progress Notes (Signed)
Per Dr. Bridgett Larsson okay to place order for wound care nurse to look at [facility].

## 2017-03-24 NOTE — Progress Notes (Signed)
Joseph Goodwin  A and O x 4. VSS. Pt tolerating diet well. No complaints of pain or nausea. IV removed intact, prescriptions given. Pt voiced understanding of discharge instructions with no further questions. Report called to [facility]. Pt discharged via wheelchair with nurse. Pts daughter to transport to facility.   Lynann Bologna MSN, RN-BC  Allergies as of 03/24/2017   No Known Allergies     Medication List    STOP taking these medications   furosemide 20 MG tablet Commonly known as:  LASIX   spironolactone 100 MG tablet Commonly known as:  ALDACTONE     TAKE these medications   atorvastatin 40 MG tablet Commonly known as:  LIPITOR Take 40 mg daily by mouth.   dofetilide 250 MCG capsule Commonly known as:  TIKOSYN Take 250 mcg 2 (two) times daily by mouth.   ELIQUIS 2.5 MG Tabs tablet Generic drug:  apixaban Take 2.5 mg 2 (two) times daily by mouth.   fexofenadine 180 MG tablet Commonly known as:  ALLEGRA Take 180 mg daily by mouth.   Lysine 1000 MG Tabs Take 1 tablet daily by mouth.   magnesium hydroxide 400 MG/5ML suspension Commonly known as:  MILK OF MAGNESIA Take 30 mLs daily as needed by mouth for mild constipation.   magnesium oxide 400 MG tablet Commonly known as:  MAG-OX Take 400 mg 2 (two) times daily by mouth.   multivitamin tablet Take 1 tablet daily by mouth.   ondansetron 4 MG disintegrating tablet Commonly known as:  ZOFRAN-ODT Take 4 mg every 8 (eight) hours as needed by mouth for nausea or vomiting.   oxycodone 5 MG capsule Commonly known as:  OXY-IR Take 5 mg every 4 (four) hours as needed by mouth.   polyethylene glycol packet Commonly known as:  MIRALAX / GLYCOLAX Take 17 g daily by mouth.   SENNA S PO Take 2 tablets daily by mouth.   sertraline 50 MG tablet Commonly known as:  ZOLOFT Take 50 mg daily by mouth.   simethicone 80 MG chewable tablet Commonly known as:  MYLICON Chew 80 mg every 6 (six) hours as needed by mouth for  flatulence.       Vitals:   03/24/17 0800 03/24/17 1408  BP: (!) 123/58 (!) 133/58  Pulse: 61 70  Resp:    Temp:  98.2 F (36.8 C)  SpO2:  99%

## 2017-03-24 NOTE — Discharge Summary (Signed)
Joseph Goodwin at [place] NAME: Joseph Goodwin    MR#:  [id number]  DATE OF BIRTH:  [birth year]  DATE OF ADMISSION:  03/23/2017   ADMITTING PHYSICIAN: Henreitta Leber, MD  DATE OF DISCHARGE: 03/24/2017  PRIMARY CARE PHYSICIAN: Glennie Hawk, MD   ADMISSION DIAGNOSIS:  post op problems DISCHARGE DIAGNOSIS:  Active Problems:   Acute on chronic renal failure ([place])  SECONDARY DIAGNOSIS:   Past Medical History:  Diagnosis Date  . Atrial fibrillation ([place])   . Bladder cancer [facility])    HOSPITAL COURSE:  83 year old male with past medical history of bladder cancer status post recent cystectomy/prostatectomy and also ileal conduit placement, chronic atrial fibrillation, history of CHF, hyperlipidemia who presents to the hospital due to generalized weakness and noted to be in acute on chronic renal failure.  1. Acute on chronic renal failure-secondary to dehydration, poor by mouth intake and also concomitant use of diuretics. -Patient's baseline creatinine is around 1.6-1.8 and elevated 2.4. hold his Lasix, Aldactone. Renal function is improving with IV fluids, follow BMP as outpatient.  2. History of chronic atrial fibrillation-currently in normal sinus rhythm and rate controlled. -Continue dofetilide, continue Eliquis.  3. History of congestive heart failure-clinically patient is not in congestive heart failure. Hold Lasix, Aldactone given the patient's acute on chronic kidney injury.  Follow-up with PCP to resume.  4. Hyperlipidemia-continue atorvastatin.  5. History of bladder cancer status post recent cystectomy/prostatectomy and also ileal conduit placement-patient follows up at [facility]. -Continue oxycodone as needed for pain.  Follow-up with [facility] urology.  6. Depression-continue Zoloft. DISCHARGE CONDITIONS:  Stable, discharged to skilled nursing facility today. CONSULTS OBTAINED:   DRUG ALLERGIES:  No Known Allergies DISCHARGE  MEDICATIONS:   Allergies as of 03/24/2017   No Known Allergies     Medication List    STOP taking these medications   furosemide 20 MG tablet Commonly known as:  LASIX   spironolactone 100 MG tablet Commonly known as:  ALDACTONE     TAKE these medications   atorvastatin 40 MG tablet Commonly known as:  LIPITOR Take 40 mg daily by mouth.   dofetilide 250 MCG capsule Commonly known as:  TIKOSYN Take 250 mcg 2 (two) times daily by mouth.   ELIQUIS 2.5 MG Tabs tablet Generic drug:  apixaban Take 2.5 mg 2 (two) times daily by mouth.   fexofenadine 180 MG tablet Commonly known as:  ALLEGRA Take 180 mg daily by mouth.   Lysine 1000 MG Tabs Take 1 tablet daily by mouth.   magnesium hydroxide 400 MG/5ML suspension Commonly known as:  MILK OF MAGNESIA Take 30 mLs daily as needed by mouth for mild constipation.   magnesium oxide 400 MG tablet Commonly known as:  MAG-OX Take 400 mg 2 (two) times daily by mouth.   multivitamin tablet Take 1 tablet daily by mouth.   ondansetron 4 MG disintegrating tablet Commonly known as:  ZOFRAN-ODT Take 4 mg every 8 (eight) hours as needed by mouth for nausea or vomiting.   oxycodone 5 MG capsule Commonly known as:  OXY-IR Take 5 mg every 4 (four) hours as needed by mouth.   polyethylene glycol packet Commonly known as:  MIRALAX / GLYCOLAX Take 17 g daily by mouth.   SENNA S PO Take 2 tablets daily by mouth.   sertraline 50 MG tablet Commonly known as:  ZOLOFT Take 50 mg daily by mouth.   simethicone 80 MG chewable tablet Commonly known as:  MYLICON Chew 80 mg every 6 (six) hours as needed by mouth for flatulence.        DISCHARGE INSTRUCTIONS:  See AVS.  If you experience worsening of your admission symptoms, develop shortness of breath, life threatening emergency, suicidal or homicidal thoughts you must seek medical attention immediately by calling 911 or calling your MD immediately  if symptoms less severe.  You  Must read complete instructions/literature along with all the possible adverse reactions/side effects for all the Medicines you take and that have been prescribed to you. Take any new Medicines after you have completely understood and accpet all the possible adverse reactions/side effects.   Please note  You were cared for by a hospitalist during your hospital stay. If you have any questions about your discharge medications or the care you received while you were in the hospital after you are discharged, you can call the unit and asked to speak with the hospitalist on call if the hospitalist that took care of you is not available. Once you are discharged, your primary care physician will handle any further medical issues. Please note that NO REFILLS for any discharge medications will be authorized once you are discharged, as it is imperative that you return to your primary care physician (or establish a relationship with a primary care physician if you do not have one) for your aftercare needs so that they can reassess your need for medications and monitor your lab values.    On the day of Discharge:  VITAL SIGNS:  Blood pressure (!) 123/58, pulse 61, temperature 98 F (36.7 C), temperature source Oral, resp. rate 20, height 5\' 9"  (1.753 m), weight 169 lb 11.2 oz (77 kg), SpO2 96 %. PHYSICAL EXAMINATION:  GENERAL:  83 y.o.-year-old patient lying in the bed with no acute distress.  EYES: Pupils equal, round, reactive to light and accommodation. No scleral icterus. Extraocular muscles intact.  HEENT: Head atraumatic, normocephalic. Oropharynx and nasopharynx clear.  NECK:  Supple, no jugular venous distention. No thyroid enlargement, no tenderness.  LUNGS: Normal breath sounds bilaterally, no wheezing, rales,rhonchi or crepitation. No use of accessory muscles of respiration.  CARDIOVASCULAR: S1, S2 normal. No murmurs, rubs, or gallops.  ABDOMEN: Soft, non-tender, non-distended. Bowel sounds present.  No organomegaly or mass.  Urostomy in place. EXTREMITIES: No pedal edema, cyanosis, or clubbing.  NEUROLOGIC: Cranial nerves II through XII are intact. Muscle strength 4/5 in all extremities. Sensation intact. Gait not checked.  PSYCHIATRIC: The patient is alert and oriented x 3.  SKIN: No obvious rash, lesion, or ulcer.  DATA REVIEW:   CBC Recent Labs  Lab 03/24/17 0424  WBC 6.2  HGB 11.6*  HCT 34.8*  PLT 161    Chemistries  Recent Labs  Lab 03/24/17 0424  NA 136  K 4.6  CL 108  CO2 21*  GLUCOSE 96  BUN 51*  CREATININE 2.06*  CALCIUM 9.7  MG 1.9     Microbiology Results  No results found for this or any previous visit.  RADIOLOGY:  US Renal  Result Date: 03/23/2017 CLINICAL DATA:  Patient with elevated creatinine. EXAM: RENAL / URINARY TRACT ULTRASOUND COMPLETE COMPARISON:  None. FINDINGS: Right Kidney: Length: 8.7 cm. Increased renal cortical echogenicity. Mild renal cortical thinning. No hydronephrosis. There is a 2.0 x 1.7 x 2.3 cm complex cystic lesion. Left Kidney: Length: 10.2 cm. Increased renal cortical echogenicity. Mild renal cortical thinning. No hydronephrosis. Bladder: Surgically absent. IMPRESSION: Increased renal cortical echogenicity as can be seen with chronic medical  renal disease. No hydronephrosis. Indeterminate complex cystic lesion interpolar region right kidney. Consider further evaluation with CT or MRI as clinically indicated in the nonacute setting. Electronically Signed   By: Lovey Newcomer M.D.   On: 03/23/2017 17:53     Management plans discussed with the patient, his daughter and they are in agreement.  CODE STATUS: Full Code   TOTAL TIME TAKING CARE OF THIS PATIENT: 38 minutes.    Demetrios Loll M.D on 03/24/2017 at 12:29 PM  Between 7am to 6pm - Pager - [phone]  After 6pm go to [email or web address] - Proofreader  [facility]  Office  [phone]  CC: Primary care physician; Glennie Hawk,  MD   Note: This dictation was prepared with Dragon dictation along with smaller phrase technology. Any transcriptional errors that result from this process are unintentional.

## 2017-03-25 LAB — URINE CULTURE

## 2017-03-28 DIAGNOSIS — K7030 Alcoholic cirrhosis of liver without ascites: Secondary | ICD-10-CM

## 2017-03-28 DIAGNOSIS — F39 Unspecified mood [affective] disorder: Secondary | ICD-10-CM | POA: Diagnosis not present

## 2017-03-28 DIAGNOSIS — S37009A Unspecified injury of unspecified kidney, initial encounter: Secondary | ICD-10-CM | POA: Diagnosis not present

## 2017-03-28 DIAGNOSIS — N183 Chronic kidney disease, stage 3 (moderate): Secondary | ICD-10-CM | POA: Diagnosis not present

## 2017-03-28 DIAGNOSIS — Z936 Other artificial openings of urinary tract status: Secondary | ICD-10-CM | POA: Diagnosis not present

## 2017-08-31 ENCOUNTER — Other Ambulatory Visit: Payer: Self-pay | Admitting: Otolaryngology

## 2017-08-31 ENCOUNTER — Ambulatory Visit
Admission: RE | Admit: 2017-08-31 | Discharge: 2017-08-31 | Disposition: A | Discharge status: Home / Self Care | Payer: Self-pay | Source: Ambulatory Visit | Attending: Otolaryngology | Admitting: Otolaryngology

## 2017-08-31 DIAGNOSIS — R52 Pain, unspecified: Secondary | ICD-10-CM

## 2017-09-07 ENCOUNTER — Other Ambulatory Visit: Payer: Self-pay | Admitting: Otolaryngology

## 2017-09-07 DIAGNOSIS — R221 Localized swelling, mass and lump, neck: Secondary | ICD-10-CM

## 2017-09-12 ENCOUNTER — Other Ambulatory Visit: Payer: Self-pay | Admitting: Otolaryngology

## 2017-09-12 ENCOUNTER — Ambulatory Visit
Admission: RE | Admit: 2017-09-12 | Discharge: 2017-09-12 | Disposition: A | Discharge status: Home / Self Care | Payer: Medicare Other | Source: Ambulatory Visit | Attending: Otolaryngology | Admitting: Otolaryngology

## 2017-09-12 DIAGNOSIS — R221 Localized swelling, mass and lump, neck: Secondary | ICD-10-CM

## 2017-09-12 HISTORY — DX: Alcohol dependence, uncomplicated: F10.20

## 2017-09-12 HISTORY — DX: Myoneural disorder, unspecified: G70.9

## 2017-09-12 MED ORDER — FENTANYL CITRATE (PF) 100 MCG/2ML IJ SOLN
INTRAMUSCULAR | Status: AC | PRN
  Administered 2017-09-12: 50 ug via INTRAVENOUS

## 2017-09-12 MED ORDER — SODIUM CHLORIDE 0.9 % IV SOLN
INTRAVENOUS | Status: DC
  Administered 2017-09-12: 1000 mL via INTRAVENOUS

## 2017-09-12 MED ORDER — MIDAZOLAM HCL 5 MG/5ML IJ SOLN
INTRAMUSCULAR | Status: AC
  Filled 2017-09-12: qty 5

## 2017-09-12 MED ORDER — FENTANYL CITRATE (PF) 100 MCG/2ML IJ SOLN
INTRAMUSCULAR | Status: AC
  Filled 2017-09-12: qty 2

## 2017-09-12 NOTE — Procedures (Signed)
Interventional Radiology Procedure Note  Procedure: US guided FNA of left parotid lesion  Complications: None  Estimated Blood Loss: None  Findings: Heavily calcified nodular area in inferior tail of left parotid gland, roughly 1.5 cm in diameter. FNA with 25 G and single 22 G needle.  Difficult to penetrate lesion due to heavily calcified wall.  Venetia Night. Kathlene Cote, M.D Pager:  [phone]

## 2017-09-13 LAB — CYTOLOGY - NON PAP

## 2017-09-18 ENCOUNTER — Encounter: Payer: Self-pay | Admitting: *Deleted

## 2017-09-19 ENCOUNTER — Ambulatory Visit: Payer: Medicare Other | Admitting: Certified Registered Nurse Anesthetist

## 2017-09-19 ENCOUNTER — Encounter: Payer: Self-pay | Admitting: Certified Registered Nurse Anesthetist

## 2017-09-19 ENCOUNTER — Ambulatory Visit
Admission: RE | Admit: 2017-09-19 | Discharge: 2017-09-19 | Disposition: A | Discharge status: Home / Self Care | Payer: Medicare Other | Source: Ambulatory Visit | Attending: Gastroenterology | Admitting: Gastroenterology

## 2017-09-19 ENCOUNTER — Encounter
Admission: RE | Disposition: A | Discharge status: Home / Self Care | Payer: Self-pay | Source: Ambulatory Visit | Attending: Gastroenterology

## 2017-09-19 DIAGNOSIS — D509 Iron deficiency anemia, unspecified: Secondary | ICD-10-CM | POA: Insufficient documentation

## 2017-09-19 DIAGNOSIS — Z87891 Personal history of nicotine dependence: Secondary | ICD-10-CM | POA: Insufficient documentation

## 2017-09-19 DIAGNOSIS — Z86711 Personal history of pulmonary embolism: Secondary | ICD-10-CM | POA: Diagnosis not present

## 2017-09-19 DIAGNOSIS — I4891 Unspecified atrial fibrillation: Secondary | ICD-10-CM | POA: Insufficient documentation

## 2017-09-19 DIAGNOSIS — I493 Ventricular premature depolarization: Secondary | ICD-10-CM | POA: Diagnosis not present

## 2017-09-19 DIAGNOSIS — K2950 Unspecified chronic gastritis without bleeding: Secondary | ICD-10-CM | POA: Insufficient documentation

## 2017-09-19 DIAGNOSIS — K5730 Diverticulosis of large intestine without perforation or abscess without bleeding: Secondary | ICD-10-CM | POA: Insufficient documentation

## 2017-09-19 DIAGNOSIS — Z7901 Long term (current) use of anticoagulants: Secondary | ICD-10-CM | POA: Diagnosis not present

## 2017-09-19 DIAGNOSIS — I10 Essential (primary) hypertension: Secondary | ICD-10-CM | POA: Insufficient documentation

## 2017-09-19 DIAGNOSIS — K621 Rectal polyp: Secondary | ICD-10-CM | POA: Diagnosis not present

## 2017-09-19 DIAGNOSIS — E7800 Pure hypercholesterolemia, unspecified: Secondary | ICD-10-CM | POA: Insufficient documentation

## 2017-09-19 DIAGNOSIS — Z86718 Personal history of other venous thrombosis and embolism: Secondary | ICD-10-CM | POA: Diagnosis not present

## 2017-09-19 DIAGNOSIS — Z79899 Other long term (current) drug therapy: Secondary | ICD-10-CM | POA: Diagnosis not present

## 2017-09-19 DIAGNOSIS — Z8551 Personal history of malignant neoplasm of bladder: Secondary | ICD-10-CM | POA: Diagnosis not present

## 2017-09-19 DIAGNOSIS — M1990 Unspecified osteoarthritis, unspecified site: Secondary | ICD-10-CM | POA: Diagnosis not present

## 2017-09-19 DIAGNOSIS — K7030 Alcoholic cirrhosis of liver without ascites: Secondary | ICD-10-CM | POA: Diagnosis not present

## 2017-09-19 DIAGNOSIS — K449 Diaphragmatic hernia without obstruction or gangrene: Secondary | ICD-10-CM | POA: Insufficient documentation

## 2017-09-19 HISTORY — PX: COLONOSCOPY WITH PROPOFOL: SHX5780

## 2017-09-19 HISTORY — DX: Ventricular premature depolarization: I49.3

## 2017-09-19 HISTORY — DX: Personal history of pulmonary embolism: Z86.711

## 2017-09-19 HISTORY — DX: Allergy status to unspecified drugs, medicaments and biological substances: Z88.9

## 2017-09-19 HISTORY — DX: Cardiac arrhythmia, unspecified: I49.9

## 2017-09-19 HISTORY — DX: Gastrointestinal hemorrhage, unspecified: K92.2

## 2017-09-19 HISTORY — DX: Pure hypercholesterolemia, unspecified: E78.00

## 2017-09-19 HISTORY — DX: Personal history of other venous thrombosis and embolism: Z86.718

## 2017-09-19 HISTORY — DX: Unspecified osteoarthritis, unspecified site: M19.90

## 2017-09-19 HISTORY — DX: Cortical age-related cataract, unspecified eye: H25.019

## 2017-09-19 HISTORY — DX: Essential (primary) hypertension: I10

## 2017-09-19 HISTORY — DX: Acute embolism and thrombosis of unspecified deep veins of unspecified lower extremity: I82.409

## 2017-09-19 HISTORY — DX: Alcoholic cirrhosis of liver without ascites: K70.30

## 2017-09-19 HISTORY — PX: ESOPHAGOGASTRODUODENOSCOPY (EGD) WITH PROPOFOL: SHX5813

## 2017-09-19 SURGERY — COLONOSCOPY WITH PROPOFOL
Anesthesia: General

## 2017-09-19 MED ORDER — PHENYLEPHRINE HCL 10 MG/ML IJ SOLN
INTRAMUSCULAR | Status: DC | PRN
  Administered 2017-09-19: 200 ug via INTRAVENOUS
  Administered 2017-09-19 (×5): 100 ug via INTRAVENOUS
  Administered 2017-09-19: 200 ug via INTRAVENOUS
  Administered 2017-09-19: 100 ug via INTRAVENOUS

## 2017-09-19 MED ORDER — PROPOFOL 500 MG/50ML IV EMUL
INTRAVENOUS | Status: DC | PRN
  Administered 2017-09-19: 130 ug/kg/min via INTRAVENOUS

## 2017-09-19 MED ORDER — EPHEDRINE SULFATE 50 MG/ML IJ SOLN
INTRAMUSCULAR | Status: DC | PRN
  Administered 2017-09-19: 20 mg via INTRAVENOUS

## 2017-09-19 MED ORDER — LIDOCAINE HCL (PF) 2 % IJ SOLN
INTRAMUSCULAR | Status: AC
  Filled 2017-09-19: qty 10

## 2017-09-19 MED ORDER — SODIUM CHLORIDE 0.9 % IV SOLN: INTRAVENOUS | Status: DC

## 2017-09-19 MED ORDER — PROPOFOL 10 MG/ML IV BOLUS
INTRAVENOUS | Status: DC | PRN
  Administered 2017-09-19: 21 mg via INTRAVENOUS
  Administered 2017-09-19: 80 mg via INTRAVENOUS

## 2017-09-19 MED ORDER — PROPOFOL 500 MG/50ML IV EMUL
INTRAVENOUS | Status: AC
  Filled 2017-09-19: qty 50

## 2017-09-19 MED ORDER — LIDOCAINE HCL (CARDIAC) PF 100 MG/5ML IV SOSY
PREFILLED_SYRINGE | INTRAVENOUS | Status: DC | PRN
  Administered 2017-09-19: 50 mg via INTRAVENOUS

## 2017-09-19 MED ORDER — SODIUM CHLORIDE 0.9 % IV SOLN
INTRAVENOUS | Status: DC
  Administered 2017-09-19: 1000 mL via INTRAVENOUS

## 2017-09-19 NOTE — Anesthesia Preprocedure Evaluation (Signed)
Anesthesia Evaluation  Patient identified by MRN, date of birth, ID band Patient awake    Reviewed: Allergy & Precautions, H&P , NPO status , Patient's Chart, lab work & pertinent test results, reviewed documented beta blocker date and time   History of Anesthesia Complications Negative for: history of anesthetic complications  Airway Mallampati: II  TM Distance: >3 FB Neck ROM: full  Mouth opening: Limited Mouth Opening  Dental  (+) Dental Advidsory Given, Missing, Teeth Intact   Pulmonary neg pulmonary ROS, former smoker,           Cardiovascular Exercise Tolerance: Good hypertension, (-) angina(-) CAD, (-) Past MI, (-) Cardiac Stents and (-) CABG + dysrhythmias Atrial Fibrillation (-) Valvular Problems/Murmurs     Neuro/Psych PSYCHIATRIC DISORDERS negative neurological ROS     GI/Hepatic negative GI ROS, Neg liver ROS,   Endo/Other  negative endocrine ROS  Renal/GU Renal disease  negative genitourinary   Musculoskeletal   Abdominal   Peds  Hematology negative hematology ROS (+)   Anesthesia Other Findings Past Medical History: No date: Alcoholism ([place]) No date: Allergic genetic state No date: Arrhythmia No date: Arthritis     Comment:  Osteoarthritis No date: Atrial fibrillation (HCC) No date: Bladder cancer (HCC) No date: Cataract cortical, senile No date: Cirrhosis, alcoholic (HCC) No date: DVT (deep venous thrombosis) (HCC) No date: GI bleeding No date: History of blood clots No date: History of pulmonary embolus (PE) No date: Hypercholesteremia No date: Hypertension No date: Neuromuscular disorder ([place]) No date: PVC (premature ventricular contraction)   Reproductive/Obstetrics negative OB ROS                             Anesthesia Physical Anesthesia Plan  ASA: II  Anesthesia Plan: General   Post-op Pain Management:    Induction: Intravenous  PONV Risk Score and  Plan: 2 and Propofol infusion  Airway Management Planned: Nasal Cannula  Additional Equipment:   Intra-op Plan:   Post-operative Plan:   Informed Consent: I have reviewed the patients History and Physical, chart, labs and discussed the procedure including the risks, benefits and alternatives for the proposed anesthesia with the patient or authorized representative who has indicated his/her understanding and acceptance.   Dental Advisory Given  Plan Discussed with: Anesthesiologist, CRNA and Surgeon  Anesthesia Plan Comments:         Anesthesia Quick Evaluation

## 2017-09-19 NOTE — H&P (Signed)
Outpatient short stay form Pre-procedure 09/19/2017 9:16 AM Lollie Sails MD  Primary Physician: [place] Pink, MD  Reason for visit: EGD and colonoscopy  History of present illness:   Patient is a 84 year old male presenting today as above.  He has a history of recent finding of Hemoccult positive stool as well as anemia.  Has been taking Eliquis due to a personal history of atrial fibrillation.  He also has a history of pulmonary emboli and DVT in the past.  Does have a personal history of a urostomy done over 6 months ago.  Tolerating his prep well.  He takes no other blood thinning agent.     Current Facility-Administered Medications:  .  0.9 %  sodium chloride infusion, , Intravenous, Continuous, Lollie Sails, MD, Last Rate: 20 mL/hr at 09/19/17 0905, 1,000 mL at 09/19/17 0905  Medications Prior to Admission  Medication Sig Dispense Refill Last Dose  . apixaban (ELIQUIS) 2.5 MG TABS tablet Take 2.5 mg 2 (two) times daily by mouth.   Past Week at Unknown time  . atorvastatin (LIPITOR) 40 MG tablet Take 40 mg daily by mouth.   09/18/2017 at Unknown time  . dofetilide (TIKOSYN) 250 MCG capsule Take 250 mcg 2 (two) times daily by mouth.   09/18/2017 at Unknown time  . fexofenadine (ALLEGRA) 180 MG tablet Take 180 mg daily by mouth.   Past Week at Unknown time  . furosemide (LASIX) 40 MG tablet Take 40 mg by mouth daily.   09/18/2017 at Unknown time  . Lysine 1000 MG TABS Take 1 tablet daily by mouth.   Past Week at Unknown time  . magnesium hydroxide (MILK OF MAGNESIA) 400 MG/5ML suspension Take 30 mLs daily as needed by mouth for mild constipation.   Past Week at Unknown time  . magnesium oxide (MAG-OX) 400 MG tablet Take 400 mg 2 (two) times daily by mouth.   Past Week at Unknown time  . Multiple Vitamin (MULTIVITAMIN) tablet Take 1 tablet daily by mouth.   Past Week at Unknown time  . ondansetron (ZOFRAN-ODT) 4 MG disintegrating tablet Take 4 mg every 8 (eight) hours as needed by  mouth for nausea or vomiting.   Past Week at Unknown time  . oxycodone (OXY-IR) 5 MG capsule Take 5 mg every 4 (four) hours as needed by mouth.   Past Week at Unknown time  . polyethylene glycol (MIRALAX / GLYCOLAX) packet Take 17 g daily by mouth.   09/18/2017 at Unknown time  . Sennosides-Docusate Sodium (SENNA S PO) Take 2 tablets daily by mouth.   09/18/2017 at Unknown time  . sertraline (ZOLOFT) 50 MG tablet Take 50 mg daily by mouth.   09/18/2017 at Unknown time  . simethicone (MYLICON) 80 MG chewable tablet Chew 80 mg every 6 (six) hours as needed by mouth for flatulence.   Past Month at Unknown time     No Known Allergies   Past Medical History:  Diagnosis Date  . Alcoholism ([place])   . Allergic genetic state   . Arrhythmia   . Arthritis    Osteoarthritis  . Atrial fibrillation ([place])   . Bladder cancer ([place])   . Cataract cortical, senile   . Cirrhosis, alcoholic ([place])   . DVT (deep venous thrombosis) ([place])   . GI bleeding   . History of blood clots   . History of pulmonary embolus (PE)   . Hypercholesteremia   . Hypertension   . Neuromuscular disorder ([place])   . PVC (  premature ventricular contraction)     Review of systems:      Physical Exam    Heart and lungs: Good rate and rhythm without rub or gallop, lungs are bilaterally clear.    HEENT: Cephalic atraumatic eyes are anicteric    Other:    Pertinant exam for procedure: Soft nontender nondistended bowel sounds are positive normoactive.  Urostomy location in the right lower quadrant noted pink and healthy in appearance.    Planned proceedures: EGD and colonoscopy with indicated procedures. I have discussed the risks benefits and complications of procedures to include not limited to bleeding, infection, perforation and the risk of sedation and the patient wishes to proceed.    Lollie Sails, MD Gastroenterology 09/19/2017  9:16 AM

## 2017-09-19 NOTE — Op Note (Signed)
[facility] Gastroenterology Patient Name: Joseph Goodwin Procedure Date: 09/19/2017 9:20 AM MRN: [id number] Account #: [id number] Date of Birth: 1932-03-08 Admit Type: Outpatient Age: 84 Room: [facility] ENDO ROOM 2 Gender: Male Note Status: Finalized Procedure:            Colonoscopy Indications:          Heme positive stool, Iron deficiency anemia Providers:            Lollie Sails, MD Referring MD:         Irven Easterly. Kary Kos, MD (Referring MD) Medicines:            Monitored Anesthesia Care Complications:        No immediate complications. Procedure:            Pre-Anesthesia Assessment:                       - ASA Grade Assessment: III - A patient with severe                        systemic disease.                       After obtaining informed consent, the colonoscope was                        passed under direct vision. Throughout the procedure,                        the patient's blood pressure, pulse, and oxygen                        saturations were monitored continuously. The                        Colonoscope was introduced through the anus and                        advanced to the the cecum, identified by appendiceal                        orifice and ileocecal valve. The colonoscopy was                        unusually difficult due to a redundant colon.                        Successful completion of the procedure was aided by                        changing the patient to a supine position. The patient                        tolerated the procedure well. The quality of the bowel                        preparation was good. Findings:      A few small-mouthed diverticula were found in the sigmoid colon.      Two sessile polyps were found in the rectum. The polyps were 1 to 2 mm       in size. These  polyps were removed with a cold biopsy forceps. Resection       and retrieval were complete.      The retroflexed view of the distal rectum and anal  verge was normal and       showed no anal or rectal abnormalities.      The exam was otherwise without abnormality.      The digital rectal exam was normal. Impression:           - Diverticulosis in the sigmoid colon.                       - Two 1 to 2 mm polyps in the rectum, removed with a                        cold biopsy forceps. Resected and retrieved.                       - The distal rectum and anal verge are normal on                        retroflexion view.                       - The examination was otherwise normal. Recommendation:       - Discharge patient to home.                       - Perform a small bowel follow through at appointment                        to be scheduled.                       - To visualize the small bowel, perform video capsule                        endoscopy at appointment to be scheduled. Procedure Code(s):    --- Professional ---                       [phone], Colonoscopy, flexible; with biopsy, single or                        multiple Diagnosis Code(s):    --- Professional ---                       K62.1, Rectal polyp                       R19.5, Other fecal abnormalities                       D50.9, Iron deficiency anemia, unspecified                       K57.30, Diverticulosis of large intestine without                        perforation or abscess without bleeding CPT copyright 2017 American Medical Association. All rights reserved. The codes documented in this report are preliminary and upon coder review may  be revised to meet current compliance requirements.  Lollie Sails, MD 09/19/2017 11:00:26 AM This report has been signed electronically. Number of Addenda: 0 Note Initiated On: 09/19/2017 9:20 AM Scope Withdrawal Time: 0 hours 8 minutes 31 seconds  Total Procedure Duration: 0 hours 36 minutes 2 seconds       [facility]

## 2017-09-19 NOTE — Op Note (Signed)
[facility] Gastroenterology Patient Name: Joseph Goodwin Procedure Date: 09/19/2017 9:53 AM MRN: [id number] Account #: [id number] Date of Birth: 04-24-1932 Admit Type: Outpatient Age: 84 Room: [facility] ENDO ROOM 2 Gender: Male Note Status: Finalized Procedure:            Upper GI endoscopy Indications:          Iron deficiency anemia, Heme positive stool Providers:            Lollie Sails, MD Referring MD:         Irven Easterly. Kary Kos, MD (Referring MD) Medicines:            Monitored Anesthesia Care Complications:        No immediate complications. Procedure:            Pre-Anesthesia Assessment:                       - ASA Grade Assessment: III - A patient with severe                        systemic disease.                       After obtaining informed consent, the endoscope was                        passed under direct vision. Throughout the procedure,                        the patient's blood pressure, pulse, and oxygen                        saturations were monitored continuously. The Endoscope                        was introduced through the mouth, and advanced to the                        third part of duodenum. The upper GI endoscopy was                        accomplished without difficulty. The patient tolerated                        the procedure well. Findings:      The Z-line was regular. Biopsies were taken with a cold forceps for       histology.      The exam of the esophagus was otherwise normal.      The entire examined stomach was normal. Some redundant mucosa in the       distal antrum, normal in appearance otherwise.      The examined duodenum was normal.      The cardia and gastric fundus were normal on retroflexion. Possible       small paraesophageal hernia. No evidence of cameron lesions. Impression:           - Z-line regular. Biopsied.                       - Normal stomach.                       -  Normal examined  duodenum. Recommendation:       - Discharge patient to home.                       - Perform a colonoscopy today.                       - Do an upper GI series at appointment to be scheduled. Procedure Code(s):    --- Professional ---                       [phone], Esophagogastroduodenoscopy, flexible, transoral;                        with biopsy, single or multiple Diagnosis Code(s):    --- Professional ---                       D50.9, Iron deficiency anemia, unspecified                       R19.5, Other fecal abnormalities CPT copyright 2017 American Medical Association. All rights reserved. The codes documented in this report are preliminary and upon coder review may  be revised to meet current compliance requirements. Lollie Sails, MD 09/19/2017 10:20:21 AM This report has been signed electronically. Number of Addenda: 0 Note Initiated On: 09/19/2017 9:53 AM      [facility]

## 2017-09-19 NOTE — Anesthesia Post-op Follow-up Note (Signed)
Anesthesia QCDR form completed.        

## 2017-09-19 NOTE — Transfer of Care (Signed)
Immediate Anesthesia Transfer of Care Note  Patient: Joseph Goodwin  Procedure(s) Performed: COLONOSCOPY WITH PROPOFOL (N/A ) ESOPHAGOGASTRODUODENOSCOPY (EGD) WITH PROPOFOL (N/A )  Patient Location: PACU and Endoscopy Unit  Anesthesia Type:General  Level of Consciousness: awake, alert , oriented and patient cooperative  Airway & Oxygen Therapy: Patient Spontanous Breathing and Patient connected to nasal cannula oxygen  Post-op Assessment: Report given to RN and Post -op Vital signs reviewed and stable  Post vital signs: Reviewed and stable  Last Vitals:  Vitals Value Taken Time  BP 109/64 09/19/2017 11:02 AM  Temp    Pulse 83 09/19/2017 11:03 AM  Resp 21 09/19/2017 11:03 AM  SpO2 99 % 09/19/2017 11:03 AM  Vitals shown include unvalidated device data.  Last Pain:  Vitals:   09/19/17 0850  TempSrc: Tympanic  PainSc: 0-No pain         Complications: No apparent anesthesia complications

## 2017-09-19 NOTE — Anesthesia Postprocedure Evaluation (Signed)
Anesthesia Post Note  Patient: Joseph Goodwin  Procedure(s) Performed: COLONOSCOPY WITH PROPOFOL (N/A ) ESOPHAGOGASTRODUODENOSCOPY (EGD) WITH PROPOFOL (N/A )  Patient location during evaluation: Endoscopy Anesthesia Type: General Level of consciousness: awake and alert Pain management: pain level controlled Vital Signs Assessment: post-procedure vital signs reviewed and stable Respiratory status: spontaneous breathing, nonlabored ventilation, respiratory function stable and patient connected to nasal cannula oxygen Cardiovascular status: blood pressure returned to baseline and stable Postop Assessment: no apparent nausea or vomiting Anesthetic complications: no     Last Vitals:  Vitals:   09/19/17 1130 09/19/17 1142  BP: (!) 133/58 117/65  Pulse: 62 63  Resp: 13 16  Temp:    SpO2: 100% 98%    Last Pain:  Vitals:   09/19/17 1142  TempSrc:   PainSc: 0-No pain                 Martha Clan

## 2017-09-20 ENCOUNTER — Encounter: Payer: Self-pay | Admitting: Gastroenterology

## 2017-09-20 ENCOUNTER — Other Ambulatory Visit: Payer: Self-pay | Admitting: Gastroenterology

## 2017-09-20 DIAGNOSIS — K921 Melena: Secondary | ICD-10-CM

## 2017-09-20 DIAGNOSIS — D649 Anemia, unspecified: Secondary | ICD-10-CM

## 2017-09-20 LAB — SURGICAL PATHOLOGY

## 2017-09-29 ENCOUNTER — Ambulatory Visit: Payer: Medicare Other

## 2017-10-03 ENCOUNTER — Ambulatory Visit
Admission: RE | Admit: 2017-10-03 | Discharge: 2017-10-03 | Disposition: A | Discharge status: Home / Self Care | Payer: Medicare Other | Source: Ambulatory Visit | Attending: Gastroenterology | Admitting: Gastroenterology

## 2017-10-03 DIAGNOSIS — I700 Atherosclerosis of aorta: Secondary | ICD-10-CM | POA: Insufficient documentation

## 2017-10-03 DIAGNOSIS — K228 Other specified diseases of esophagus: Secondary | ICD-10-CM | POA: Diagnosis not present

## 2017-10-03 DIAGNOSIS — D649 Anemia, unspecified: Secondary | ICD-10-CM | POA: Diagnosis present

## 2017-10-03 DIAGNOSIS — K921 Melena: Secondary | ICD-10-CM | POA: Insufficient documentation

## 2017-10-03 DIAGNOSIS — I708 Atherosclerosis of other arteries: Secondary | ICD-10-CM | POA: Diagnosis not present

## 2017-10-06 ENCOUNTER — Other Ambulatory Visit: Payer: Self-pay | Admitting: Gastroenterology

## 2017-10-10 ENCOUNTER — Other Ambulatory Visit: Payer: Self-pay | Admitting: Gastroenterology

## 2017-10-10 DIAGNOSIS — R9389 Abnormal findings on diagnostic imaging of other specified body structures: Secondary | ICD-10-CM

## 2017-10-17 ENCOUNTER — Ambulatory Visit
Admission: RE | Admit: 2017-10-17 | Discharge: 2017-10-17 | Disposition: A | Discharge status: Home / Self Care | Payer: Medicare Other | Source: Ambulatory Visit | Attending: Gastroenterology | Admitting: Gastroenterology

## 2017-10-17 DIAGNOSIS — E785 Hyperlipidemia, unspecified: Secondary | ICD-10-CM | POA: Diagnosis not present

## 2017-10-17 DIAGNOSIS — R9389 Abnormal findings on diagnostic imaging of other specified body structures: Secondary | ICD-10-CM

## 2017-10-17 DIAGNOSIS — I700 Atherosclerosis of aorta: Secondary | ICD-10-CM | POA: Insufficient documentation

## 2017-10-17 DIAGNOSIS — Z87891 Personal history of nicotine dependence: Secondary | ICD-10-CM | POA: Insufficient documentation

## 2017-10-17 DIAGNOSIS — I10 Essential (primary) hypertension: Secondary | ICD-10-CM | POA: Insufficient documentation

## 2017-12-20 ENCOUNTER — Other Ambulatory Visit: Payer: Self-pay | Admitting: Gastroenterology

## 2017-12-20 DIAGNOSIS — D649 Anemia, unspecified: Secondary | ICD-10-CM

## 2017-12-21 ENCOUNTER — Ambulatory Visit: Payer: Medicare Other

## 2018-01-12 ENCOUNTER — Other Ambulatory Visit
Admission: RE | Admit: 2018-01-12 | Discharge: 2018-01-12 | Disposition: A | Discharge status: Home / Self Care | Payer: Medicare Other | Source: Ambulatory Visit | Attending: Gastroenterology | Admitting: Gastroenterology

## 2018-01-12 DIAGNOSIS — R9389 Abnormal findings on diagnostic imaging of other specified body structures: Secondary | ICD-10-CM | POA: Diagnosis present

## 2018-01-13 LAB — H. PYLORI ANTIGEN, STOOL: H. Pylori Stool Ag, Eia: POSITIVE — AB

## 2019-02-14 DEATH — deceased

## 2020-01-14 IMAGING — PT NM OUTSIDE FILMS CHEST
3 series · 25 of 25 positions shown · non-contrast
Comparison: none

[Series 3: ct sbmt 3.0 efov · axial · 3.0mm · 1.37mm/px · z∈[-1594,-624]mm · 9 of 486 slices shown]
[im 1/486  lung]
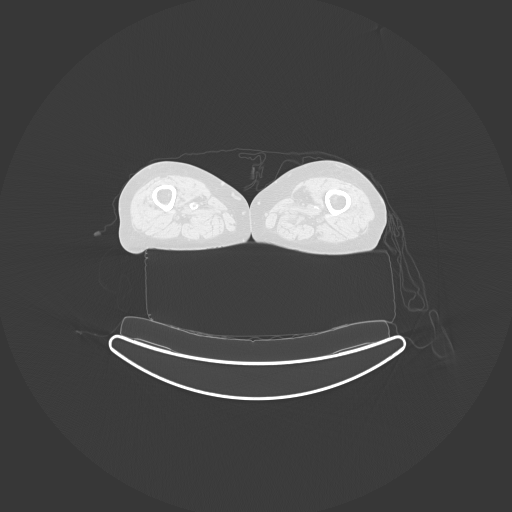
[im 61/486  lung]
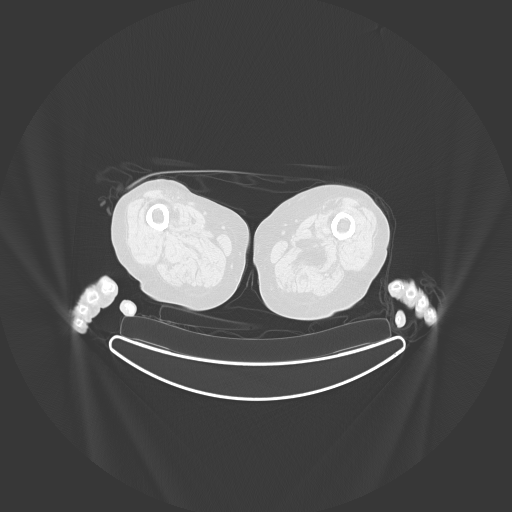
[im 122/486  lung]
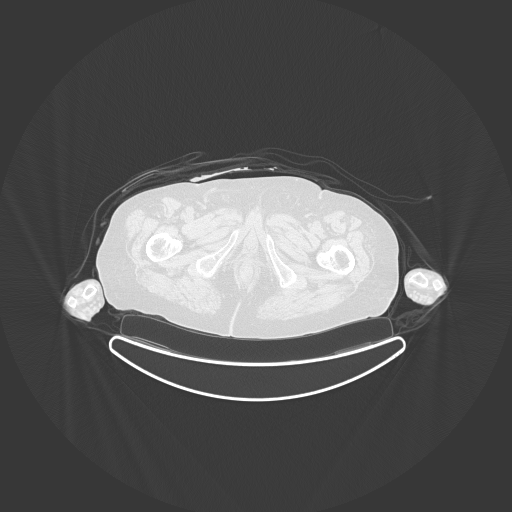
[im 182/486  lung]
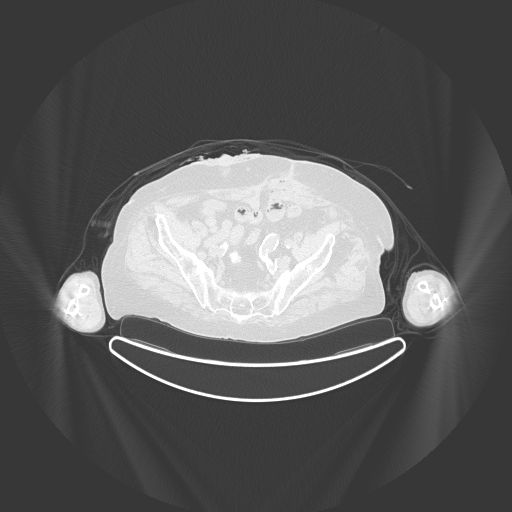
[im 243/486  lung]
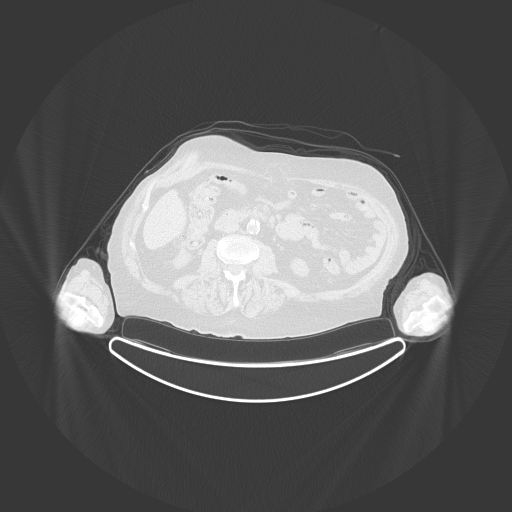
[im 304/486  lung]
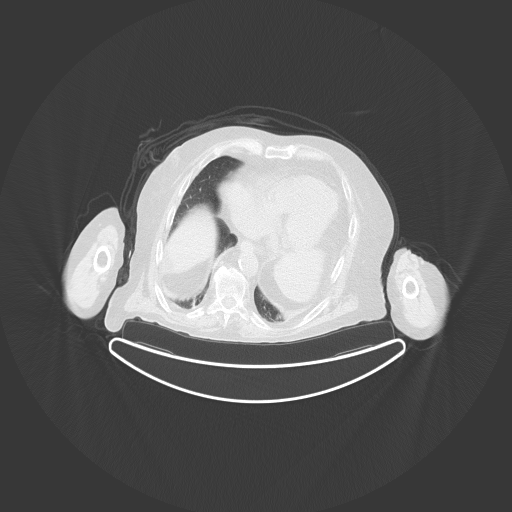
[im 364/486  lung]
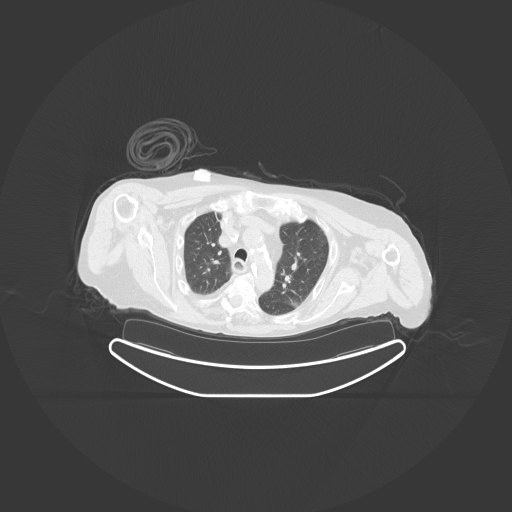
[im 425/486]
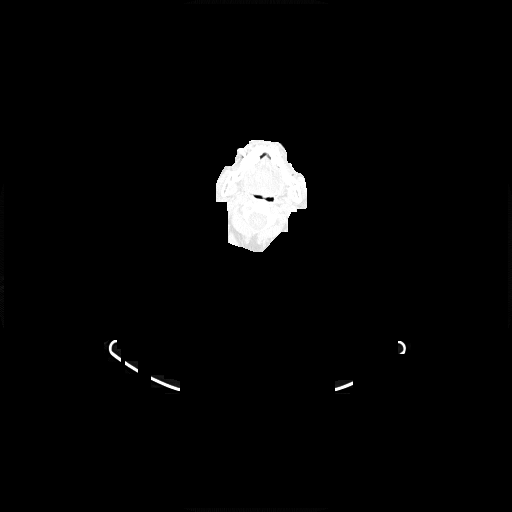
[im 486/486]
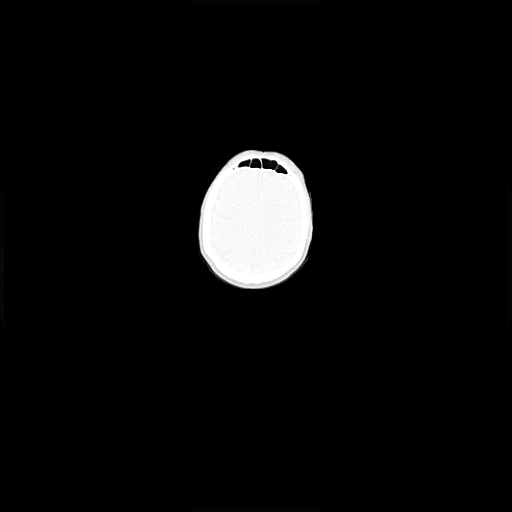

[Series 5: pet sbmt true x (ac) · axial · 3.0mm · 4.07mm/px · z∈[-1594,-624]mm · 8 of 486 slices shown]
[im 1/486]
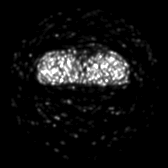
[im 70/486]
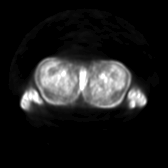
[im 139/486]
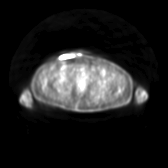
[im 208/486]
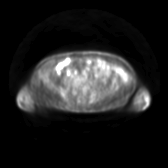
[im 278/486]
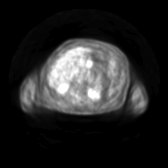
[im 347/486]
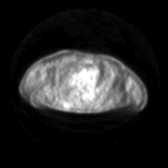
[im 416/486]
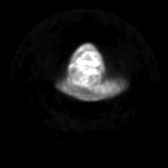
[im 486/486]
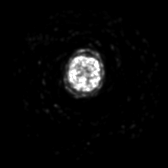

[Series 6: pet sbmt nac (nac) · axial · 3.0mm · 4.07mm/px · z∈[-1594,-624]mm · 8 of 486 slices shown]
[im 1/486]
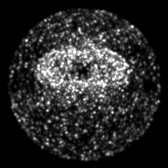
[im 70/486]
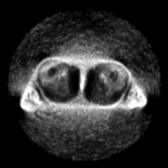
[im 139/486]
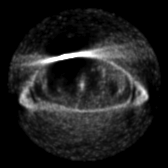
[im 208/486]
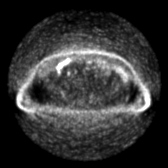
[im 278/486]
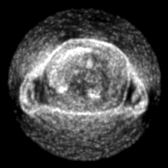
[im 347/486]
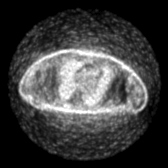
[im 416/486]
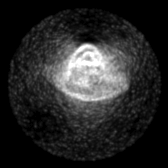
[im 486/486]
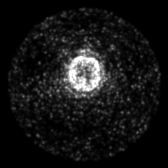

[25 of 25 positions shown; findings below may reference images not displayed]

Canned report from images found in remote index.

Refer to host system for actual result text.

## 2020-02-16 IMAGING — US US BIOPSY FNA W/ IMAGING
2 series · 12 of 12 positions shown · non-contrast
Comparison: none

CLINICAL DATA: Calcified left parotid mass.

[Series 1: us biopsy fna w/ imaging · 9 of 9 slices shown (1 of 2)]
[im 1/9]
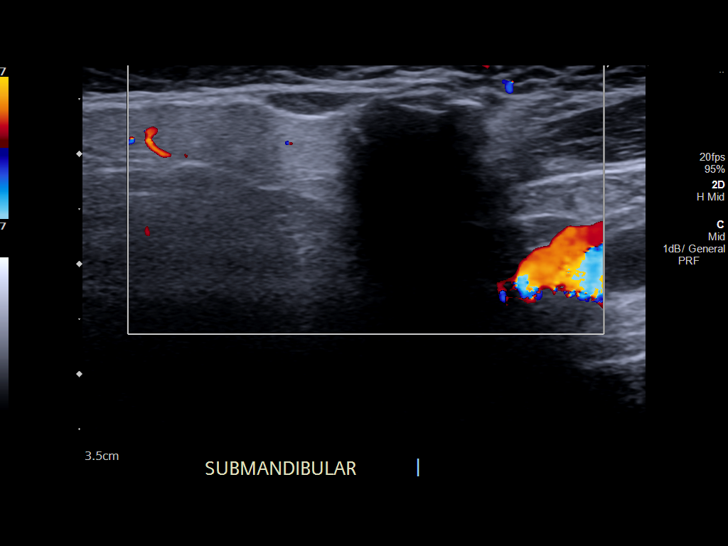
[im 2/9]
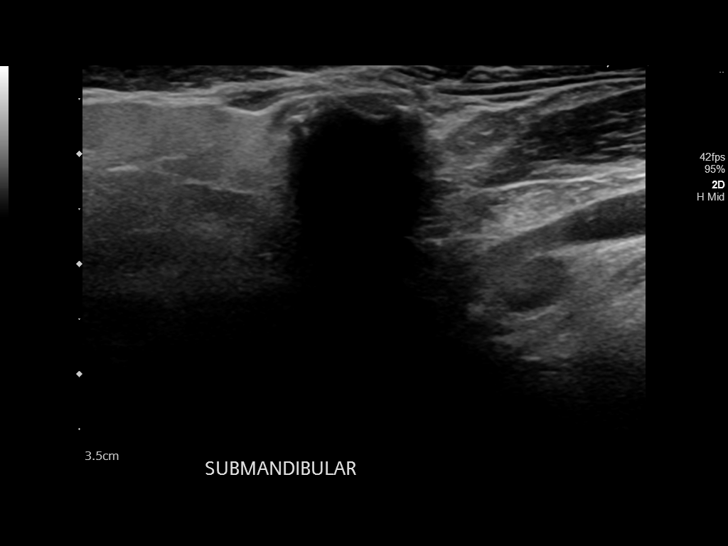
[im 3/9]
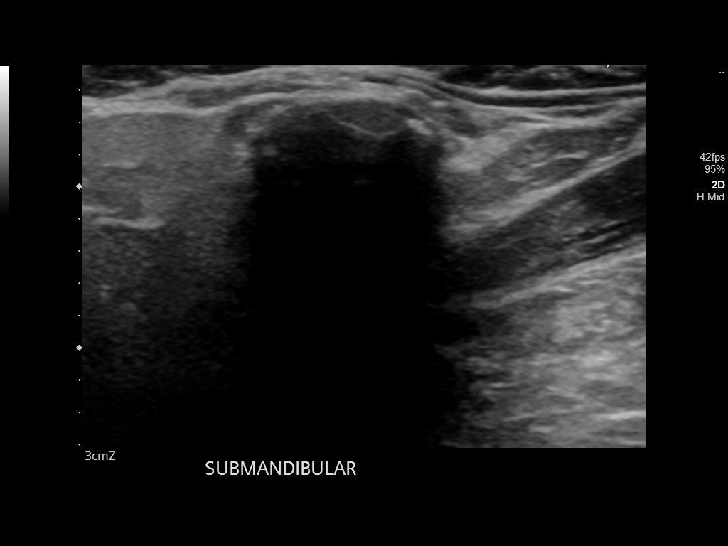
[im 4/9]
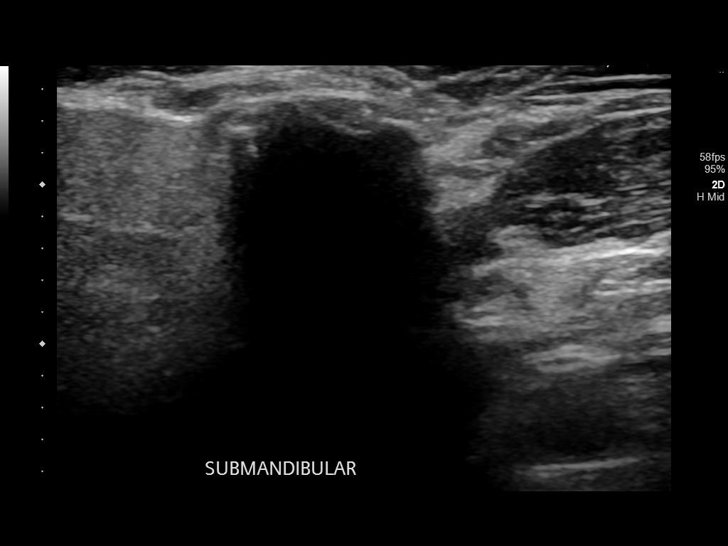
[im 5/9]
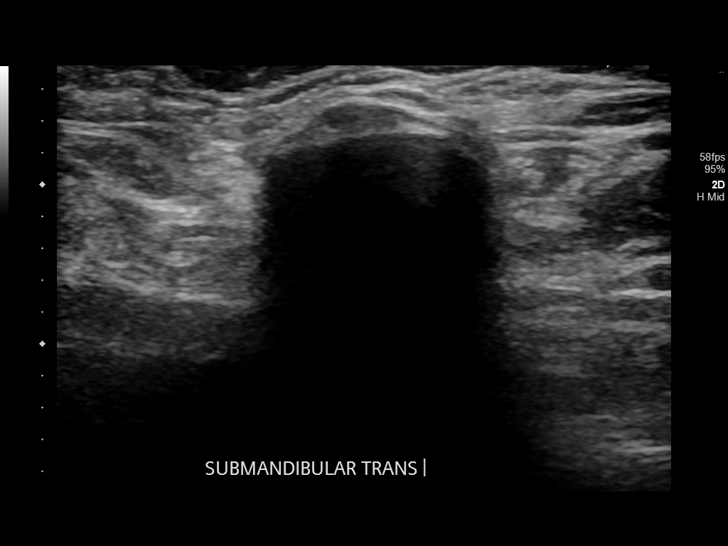
[im 6/9]
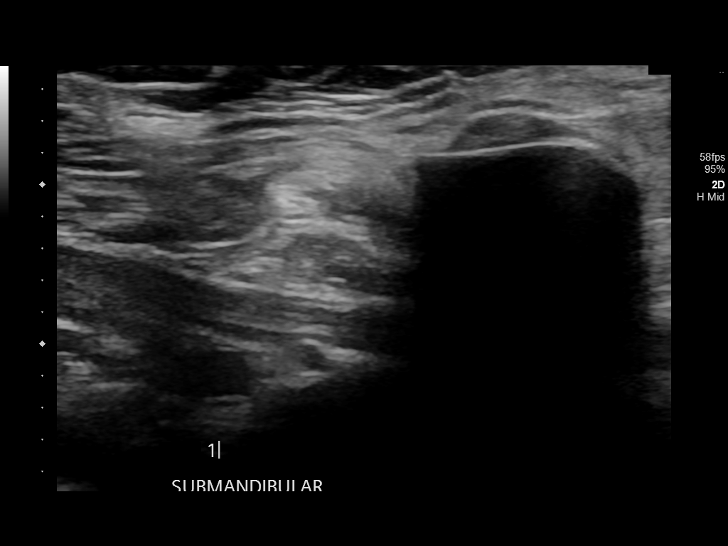
[im 7/9]
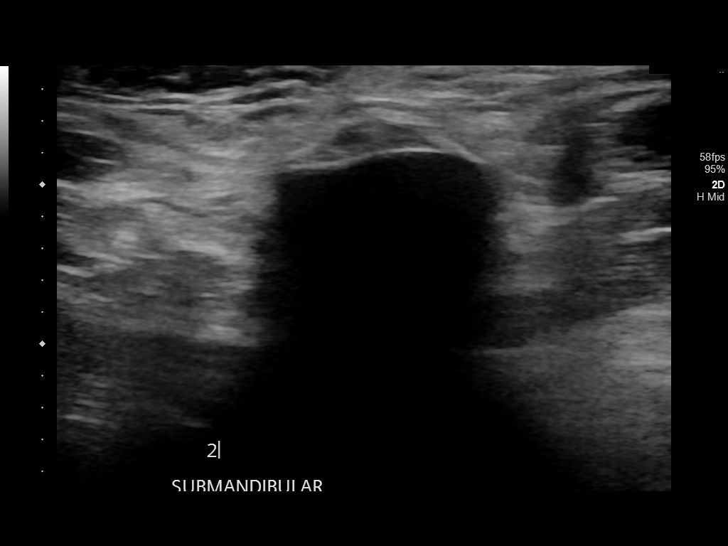
[im 8/9]
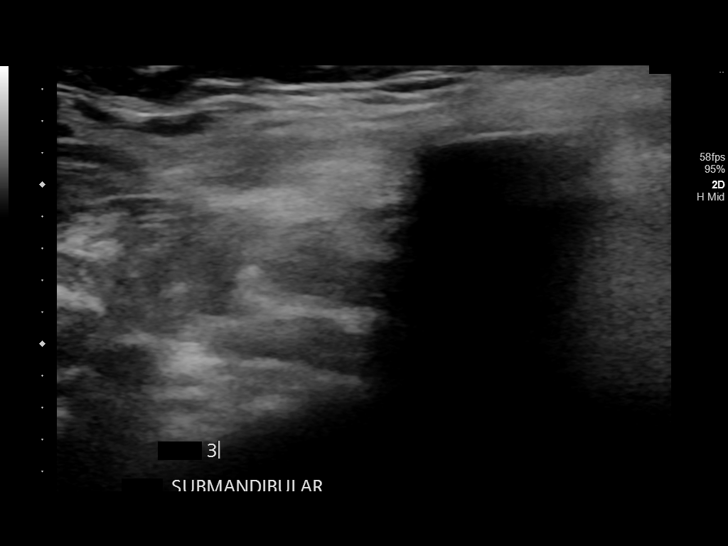
[im 9/9]
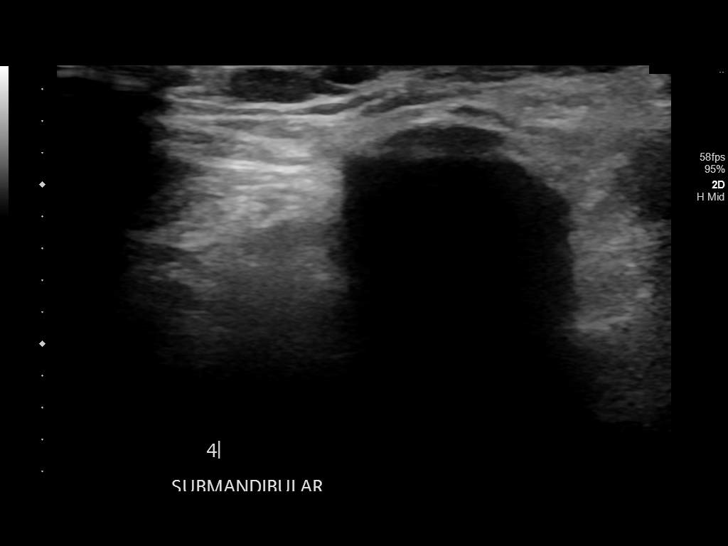

[Series 2: us biopsy fna w/ imaging · 3 of 3 slices shown (2 of 2)]
[im 1/3]
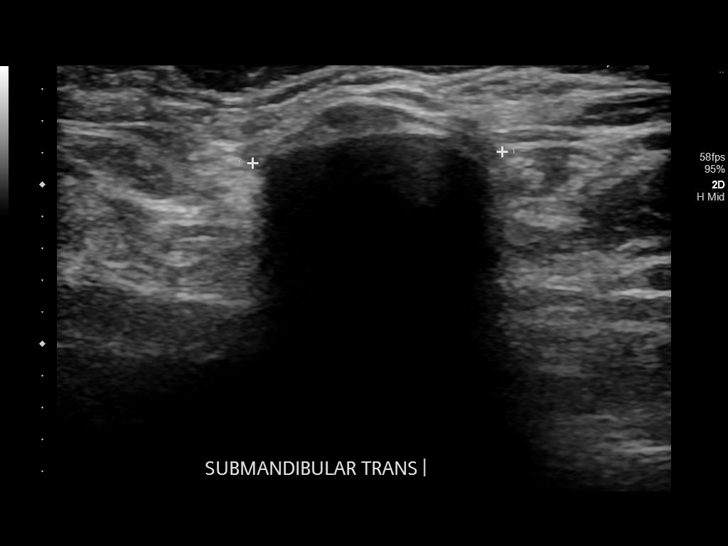
[im 2/3]
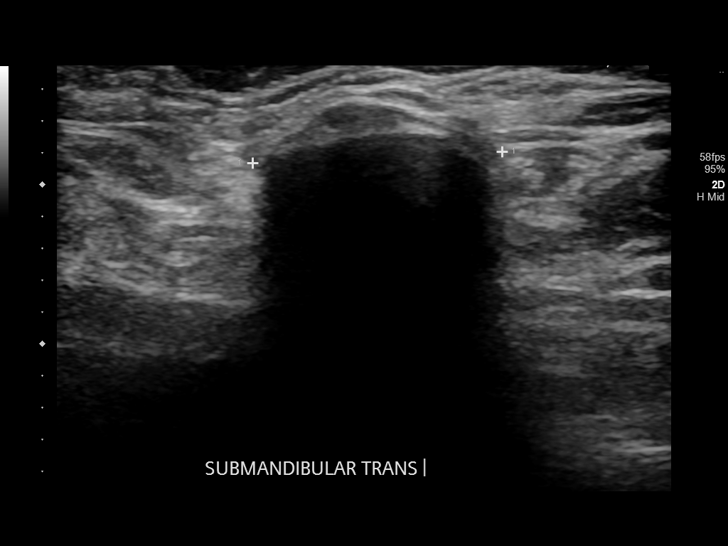
[im 3/3]
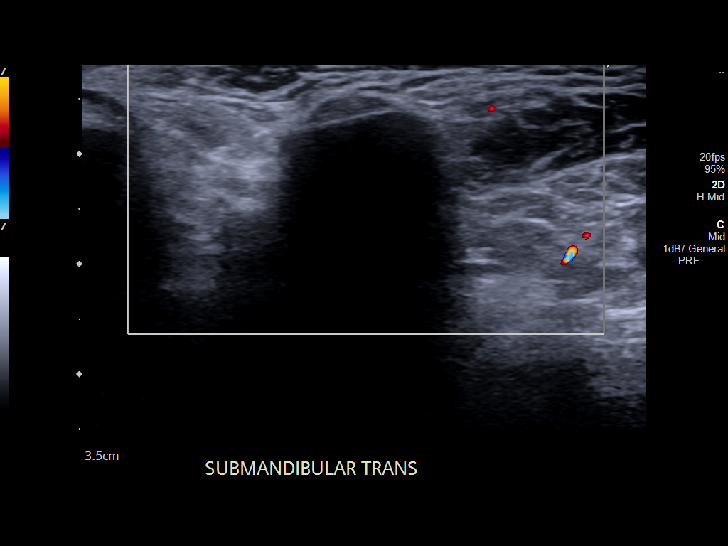

[12 of 12 positions shown; findings below may reference images not displayed]

EXAM:
ULTRASOUND GUIDED FINE-NEEDLE ASPIRATE BIOPSY OF LEFT PAROTID MASS

MEDICATIONS:
50 mcg IV Fentanyl

PROCEDURE:
The procedure, risks, benefits, and alternatives were explained to
the patient. Questions regarding the procedure were encouraged and
answered. The patient understands and consents to the procedure. A
time out was performed prior to initiating the procedure.

The left face was prepped with chlorhexidine in a sterile fashion,
and a sterile drape was applied covering the operative field. A
sterile gown and sterile gloves were used for the procedure. Local
anesthesia was provided with 1% Lidocaine.

Ultrasound was used to localize a lesion within the left parotid
gland. Under ultrasound guidance, fine needle aspirate samples were
obtained utilizing 3 separate 25 gauge needles and a single 22 gauge
needle. Aspirate samples were submitted for cytologic analysis.

COMPLICATIONS:
None.
FINDINGS: Within the superficial aspect of the inferior tail of the left
parotid gland, a densely calcified nodule is identified by
ultrasound showing posterior acoustic shadowing. This measures
approximately 1.5 cm in diameter. Due to heavily calcified
periphery, internal contents cannot be characterized by ultrasound.

It was extremely difficult to pierce the calcified rim of the nodule
with fine needles due to calcification. Fine needles were able to
penetrate the peripheral calcification. Aspirated material may be
somewhat limited as there did not appear to be much return of any
type of fluid or cellular material grossly. Final cytologic analysis
is pending.
IMPRESSION: Densely calcified 1.5 cm nodule in the inferior left parotid gland.
The lesion is predominantly calcified and was difficult to penetrate
with fine needles. Cellularity of aspirates may be therefore quite
limited. Final cytologic analysis is pending.
